# Patient Record
Sex: Male | Born: 2000 | Race: White | Hispanic: No | Marital: Single | Smoking: Former smoker
Health system: Southern US, Community
[De-identification: ages and names within clinical notes are randomized; demographics above are authoritative.]

## PROBLEM LIST (undated history)

## (undated) ENCOUNTER — Emergency Department (HOSPITAL_COMMUNITY): Disposition: A | Discharge status: Home / Self Care

## (undated) DIAGNOSIS — F419 Anxiety disorder, unspecified: Secondary | ICD-10-CM

## (undated) HISTORY — PX: MOUTH SURGERY: SHX715

---
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
## 2018-01-30 DIAGNOSIS — G8929 Other chronic pain: Secondary | ICD-10-CM | POA: Diagnosis not present

## 2018-01-30 DIAGNOSIS — Z87891 Personal history of nicotine dependence: Secondary | ICD-10-CM | POA: Diagnosis not present

## 2018-01-30 DIAGNOSIS — X509XXA Other and unspecified overexertion or strenuous movements or postures, initial encounter: Secondary | ICD-10-CM | POA: Diagnosis not present

## 2018-01-30 DIAGNOSIS — M25562 Pain in left knee: Secondary | ICD-10-CM | POA: Diagnosis not present

## 2019-05-27 ENCOUNTER — Other Ambulatory Visit: Payer: Self-pay

## 2019-05-27 ENCOUNTER — Ambulatory Visit: Payer: HRSA Program | Attending: Internal Medicine

## 2019-05-27 DIAGNOSIS — Z20828 Contact with and (suspected) exposure to other viral communicable diseases: Secondary | ICD-10-CM | POA: Diagnosis present

## 2019-05-27 DIAGNOSIS — Z20822 Contact with and (suspected) exposure to covid-19: Secondary | ICD-10-CM

## 2019-05-28 LAB — NOVEL CORONAVIRUS, NAA: SARS-CoV-2, NAA: NOT DETECTED

## 2019-06-17 ENCOUNTER — Emergency Department (HOSPITAL_COMMUNITY)
Admission: EM | Admit: 2019-06-17 | Discharge: 2019-06-17 | Disposition: A | Discharge status: Home / Self Care | Payer: No Typology Code available for payment source | Attending: Pediatric Emergency Medicine | Admitting: Pediatric Emergency Medicine

## 2019-06-17 ENCOUNTER — Emergency Department (HOSPITAL_COMMUNITY): Payer: No Typology Code available for payment source

## 2019-06-17 ENCOUNTER — Other Ambulatory Visit: Payer: Self-pay

## 2019-06-17 ENCOUNTER — Encounter (HOSPITAL_COMMUNITY): Payer: Self-pay | Admitting: Emergency Medicine

## 2019-06-17 DIAGNOSIS — F17210 Nicotine dependence, cigarettes, uncomplicated: Secondary | ICD-10-CM | POA: Insufficient documentation

## 2019-06-17 DIAGNOSIS — Y999 Unspecified external cause status: Secondary | ICD-10-CM | POA: Insufficient documentation

## 2019-06-17 DIAGNOSIS — S92355A Nondisplaced fracture of fifth metatarsal bone, left foot, initial encounter for closed fracture: Secondary | ICD-10-CM | POA: Insufficient documentation

## 2019-06-17 DIAGNOSIS — Z79899 Other long term (current) drug therapy: Secondary | ICD-10-CM | POA: Diagnosis not present

## 2019-06-17 DIAGNOSIS — Y939 Activity, unspecified: Secondary | ICD-10-CM | POA: Insufficient documentation

## 2019-06-17 DIAGNOSIS — Y929 Unspecified place or not applicable: Secondary | ICD-10-CM | POA: Insufficient documentation

## 2019-06-17 DIAGNOSIS — S99922A Unspecified injury of left foot, initial encounter: Secondary | ICD-10-CM | POA: Diagnosis present

## 2019-06-17 MED ORDER — IBUPROFEN 400 MG PO TABS
400.0000 mg | ORAL_TABLET | Freq: Once | ORAL | Status: AC
  Administered 2019-06-17: 09:00:00 400 mg via ORAL
  Filled 2019-06-17: qty 1

## 2019-06-17 NOTE — ED Notes (Signed)
patient awake alert,color pink,chest clear,good aeration,no retractions, 3plus pulses<2sec refill,patient with mother to drive home/in wr,discharged after avs reviewed

## 2019-06-17 NOTE — ED Notes (Addendum)
Please call Dan Maker Mcbride [phone]

## 2019-06-17 NOTE — ED Notes (Signed)
Patient awake alert, color pink,chest clear,good aeration,no retractions 3plus pulses<2sec refill,good pulses left foot after forklift ran over foot/lower leg, complains of pain from to to knee,10/10,provider at bedside

## 2019-06-17 NOTE — ED Notes (Signed)
Ortho at bedside.

## 2019-06-17 NOTE — ED Provider Notes (Signed)
[name] EMERGENCY DEPARTMENT Provider Note   CSN: [id number] Arrival date & time: 06/17/19  6213     History Chief Complaint  Patient presents with  . Foot Injury    Jose Ho is a 19 y.o. male.  HPI     19yo M with foot injury.  Run over by forklift prior to arrival.  Immediate pain to lower leg/foot.  Unable to bear weight.  No LOC.  No other injuries.  No other sick symptoms.  No previous injuries.  History reviewed. No pertinent past medical history.  There are no problems to display for this patient.   History reviewed. No pertinent surgical history.     No family history on file.  Social History   Tobacco Use  . Smoking status: Current Some Day Smoker  . Smokeless tobacco: Never Used  Substance Use Topics  . Alcohol use: No  . Drug use: No    Home Medications Prior to Admission medications   Medication Sig Start Date End Date Taking? Authorizing Provider  acetaminophen (TYLENOL) 325 MG tablet Take 650 mg by mouth every 6 (six) hours as needed.    [provider]  amoxicillin (AMOXIL) 500 MG capsule Take 1 capsule (500 mg total) by mouth 3 (three) times daily. 05/24/17   Lily Kocher, PA-C  HYDROcodone-acetaminophen (NORCO/VICODIN) 5-325 MG tablet Take 1 tablet by mouth every 4 (four) hours as needed. 05/24/17   Lily Kocher, PA-C  ibuprofen (ADVIL,MOTRIN) 600 MG tablet Take 1 tablet (600 mg total) by mouth 4 (four) times daily. 05/24/17   Lily Kocher, PA-C    Allergies    Patient has no known allergies.  Review of Systems   Review of Systems  Constitutional: Negative for chills and fever.  HENT: Negative for ear pain and sore throat.   Eyes: Negative for pain and visual disturbance.  Respiratory: Negative for cough and shortness of breath.   Cardiovascular: Negative for chest pain and palpitations.  Gastrointestinal: Negative for abdominal pain and vomiting.  Genitourinary: Negative for dysuria and  hematuria.  Musculoskeletal: Positive for arthralgias, gait problem, joint swelling and myalgias. Negative for back pain.  Skin: Negative for color change and rash.  Neurological: Negative for seizures and syncope.  All other systems reviewed and are negative.   Physical Exam Updated Vital Signs BP 123/72 (BP Location: Left Arm)   Pulse 81   Temp 98.4 F (36.9 C) (Oral)   Resp 19   SpO2 100%   Physical Exam Vitals and nursing note reviewed.  Constitutional:      Appearance: He is well-developed.  HENT:     Head: Normocephalic and atraumatic.     Nose: No congestion or rhinorrhea.  Eyes:     Extraocular Movements: Extraocular movements intact.     Conjunctiva/sclera: Conjunctivae normal.     Pupils: Pupils are equal, round, and reactive to light.  Cardiovascular:     Rate and Rhythm: Normal rate and regular rhythm.     Heart sounds: No murmur.  Pulmonary:     Effort: Pulmonary effort is normal. No respiratory distress.     Breath sounds: Normal breath sounds.  Abdominal:     Palpations: Abdomen is soft.     Tenderness: There is no abdominal tenderness.  Musculoskeletal:        General: Swelling and tenderness present. No deformity or signs of injury.     Cervical back: Neck supple.     Left lower leg: No edema.  Comments: Limitation to ROM at L ankle and foot, sensation intact, 2 second capillary refill, tender to palpation mid shin over fibula with pain with squeeze, over lateral maleolus and lateral foot  Skin:    General: Skin is warm and dry.     Capillary Refill: Capillary refill takes less than 2 seconds.  Neurological:     General: No focal deficit present.     Mental Status: He is alert and oriented to person, place, and time.     Cranial Nerves: No cranial nerve deficit.     Sensory: No sensory deficit.     Motor: Weakness present.     Coordination: Coordination normal.     Gait: Gait abnormal.     Deep Tendon Reflexes: Reflexes normal.     ED  Results / Procedures / Treatments   Labs (all labs ordered are listed, but only abnormal results are displayed) Labs Reviewed - No data to display  EKG None  Radiology DG Tibia/Fibula Left  Result Date: 06/17/2019 CLINICAL DATA:  Left foot injury at work, pain, initial encounter. EXAM: LEFT TIBIA AND FIBULA - 2 VIEW COMPARISON:  None. FINDINGS: No acute osseous or joint abnormality. IMPRESSION: No acute osseous or joint abnormality. Electronically Signed   By: Leanna Battles M.D.   On: 06/17/2019 08:50   DG Foot Complete Left  Result Date: 06/17/2019 CLINICAL DATA:  Posttraumatic left foot pain EXAM: LEFT FOOT - COMPLETE 3+ VIEW COMPARISON:  None. FINDINGS: Avulsion fracture at the base of the fifth metatarsal, nondisplaced. There is overlying soft tissue swelling. IMPRESSION: Nondisplaced fifth metatarsal base fracture. Electronically Signed   By: Marnee Spring M.D.   On: 06/17/2019 08:17    Procedures Procedures (including critical care time)  Medications Ordered in ED Medications  ibuprofen (ADVIL) tablet 400 mg (400 mg Oral Given 06/17/19 0845)    ED Course  I have reviewed the triage vital signs and the nursing notes.  Pertinent labs & imaging results that were available during my care of the patient were reviewed by me and considered in my medical decision making (see chart for details).    MDM Rules/Calculators/A&P                       Pt is a with out pertinent PMHX who presents w/ L leg injury.   Hemodynamically appropriate and stable on room air with normal saturations.  Lungs clear to auscultation bilaterally good air exchange.  Normal cardiac exam.  Benign abdomen.  No hip pain no knee pain bilaterally.  L tib/fib, lateral foot tender to palpation  Patient with swelling but has no obvious deformity on exam. Patient neurovascularly intact - good pulses, full movement - slightly decreased only 2/2 pain. Imaging obtained and resulted above.  Doubt nerve or vascular  injury at this time.  No other injuries appreciated on exam.  Base of 5th metatarsal fracture.  Nondisplaced on my interpretation.  Radiology read as above.  I personally reviewed and agree.  Pain control with Motrin here.  Patient placed in CAM walker and provided crutches instruction for non weight bearing with plan for close orthopedics follow-up.  D/C home in stable condition. Follow-up with orthopedics.   Final Clinical Impression(s) / ED Diagnoses Final diagnoses:  Closed nondisplaced fracture of fifth metatarsal bone of left foot, initial encounter    Rx / DC Orders ED Discharge Orders    None       Charlett Nose, MD 06/17/19 [phone]

## 2019-06-17 NOTE — Progress Notes (Signed)
Orthopedic Tech Progress Note Patient Details:  Jose Ho 04-Mar-2001 [id number]  Ortho Devices Type of Ortho Device: Crutches, CAM walker Ortho Device/Splint Location: LLE Ortho Device/Splint Interventions: Application, Ordered   Post Interventions Patient Tolerated: Ambulated well, Well Instructions Provided: Poper ambulation with device, Care of device, Adjustment of device   Donald Pore 06/17/2019, 9:38 AM

## 2019-06-17 NOTE — ED Notes (Signed)
Patient awake alert,returned from xray.tolerated po med, refuses ice,awaiting results

## 2019-06-17 NOTE — ED Triage Notes (Signed)
Pt arrives via [employer] from work with c/o a forklift running over his L foot. PMS intact, swelling and bruising present to left foot. No back or neck pain, no LOC, VSS: 129/90, HR 82, RR 16, O298% on ra, 97temp.

## 2019-06-17 NOTE — ED Notes (Signed)
Patient transported to X-ray via stretcher with tech. 

## 2020-03-04 ENCOUNTER — Other Ambulatory Visit: Payer: Self-pay

## 2020-03-04 ENCOUNTER — Encounter (HOSPITAL_COMMUNITY): Payer: Self-pay

## 2020-03-04 ENCOUNTER — Emergency Department (HOSPITAL_COMMUNITY)
Admission: EM | Admit: 2020-03-04 | Discharge: 2020-03-05 | Disposition: A | Discharge status: Home / Self Care | Payer: Self-pay | Attending: Emergency Medicine | Admitting: Emergency Medicine

## 2020-03-04 DIAGNOSIS — Z8616 Personal history of COVID-19: Secondary | ICD-10-CM | POA: Insufficient documentation

## 2020-03-04 DIAGNOSIS — R42 Dizziness and giddiness: Secondary | ICD-10-CM | POA: Insufficient documentation

## 2020-03-04 DIAGNOSIS — Z5321 Procedure and treatment not carried out due to patient leaving prior to being seen by health care provider: Secondary | ICD-10-CM | POA: Insufficient documentation

## 2020-03-04 LAB — CBG MONITORING, ED: Glucose-Capillary: 154 mg/dL — ABNORMAL HIGH (ref 70–99)

## 2020-03-04 NOTE — ED Triage Notes (Signed)
Pt reports being positive for covid, this is his 10th day, pt reports he has had intermittent numbness to both wrists, pt reports dizziness and jitters, does "not know if this is a panic attack" pt reports a lot of stress recently.

## 2020-04-12 IMAGING — CR DG FOOT COMPLETE 3+V*L*
3 series · 3 of 3 positions shown · non-contrast
Comparison: None.

CLINICAL DATA: Posttraumatic left foot pain

EXAM:
LEFT FOOT - COMPLETE 3+ VIEW

[foot ap]
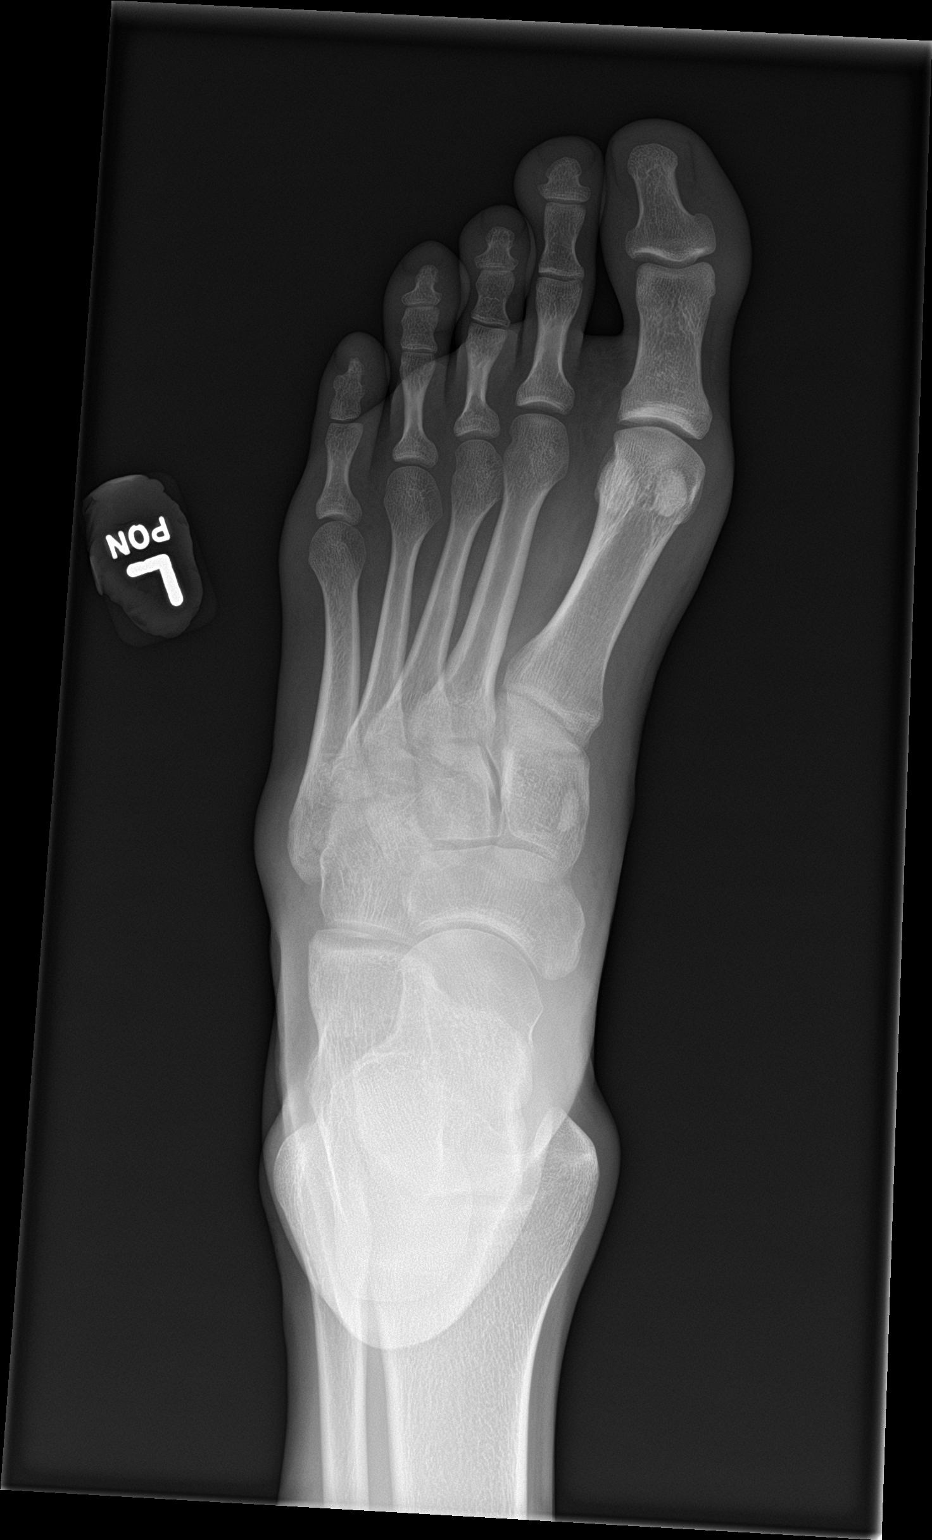

[foot obl]
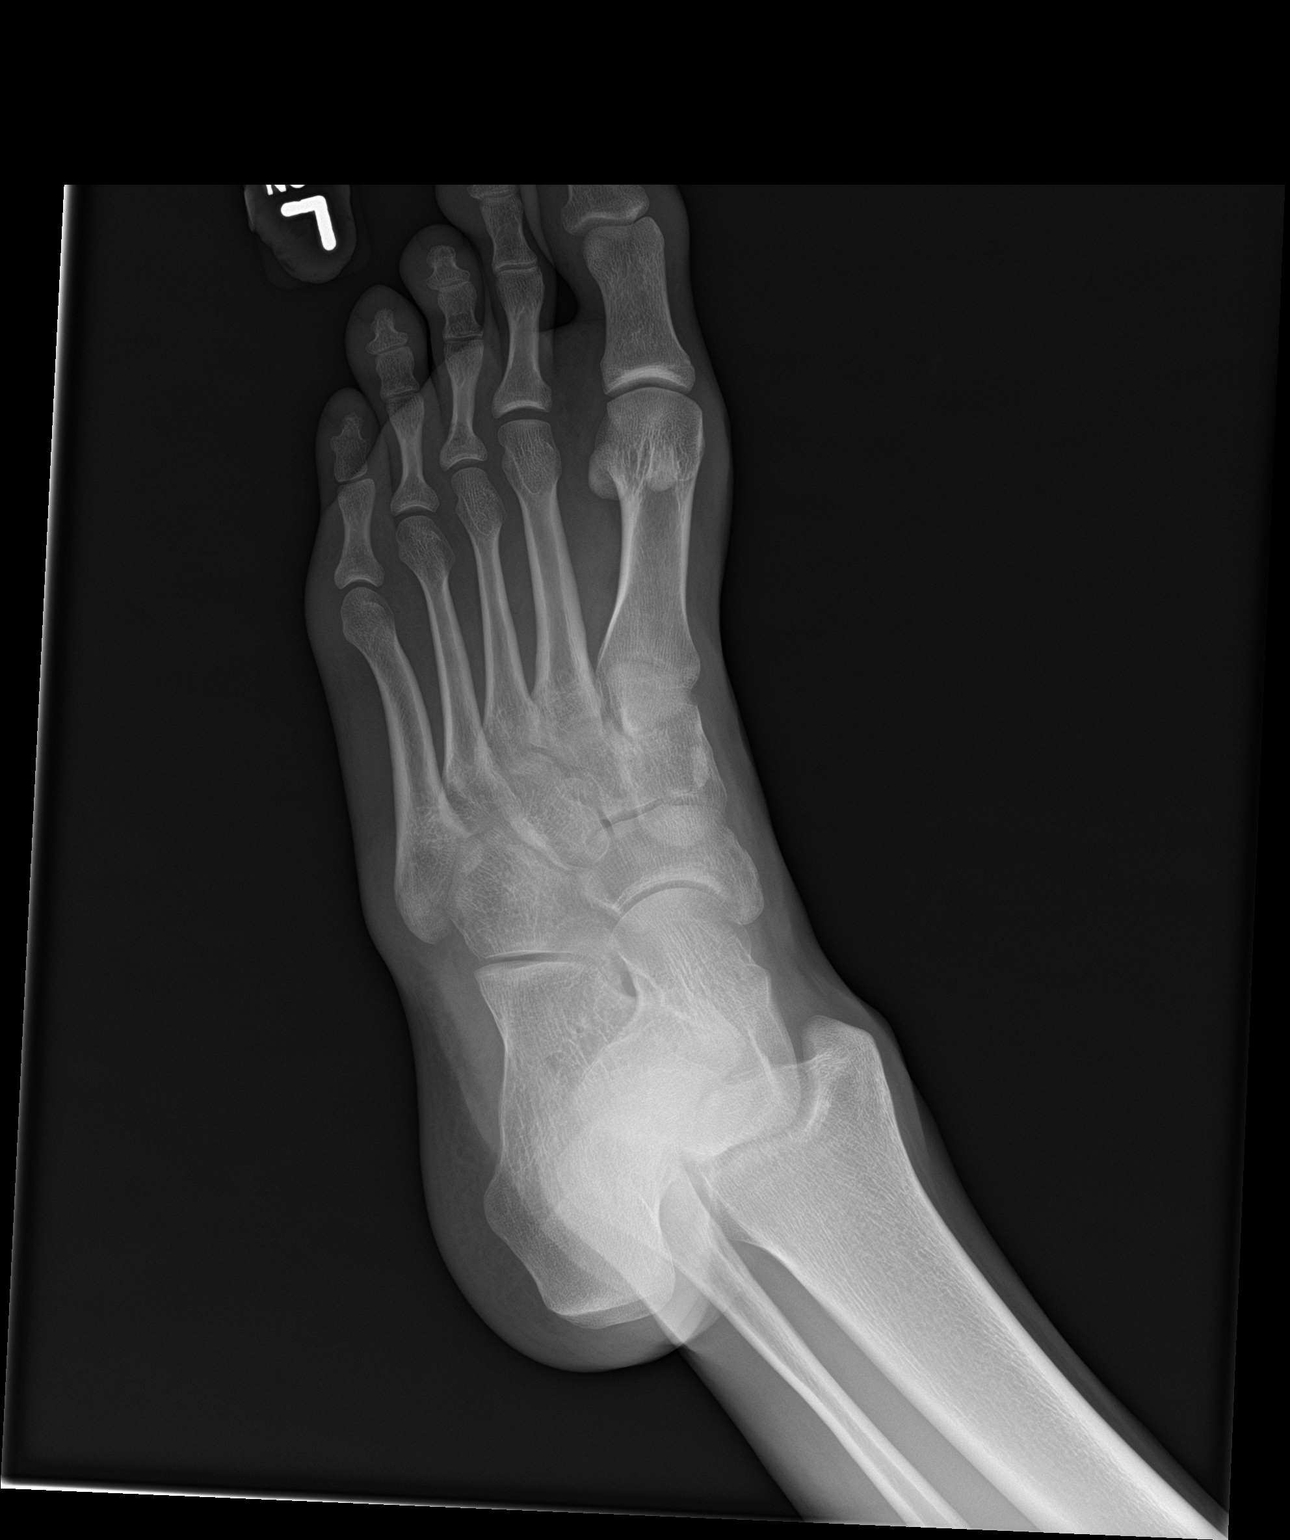

[foot lat]
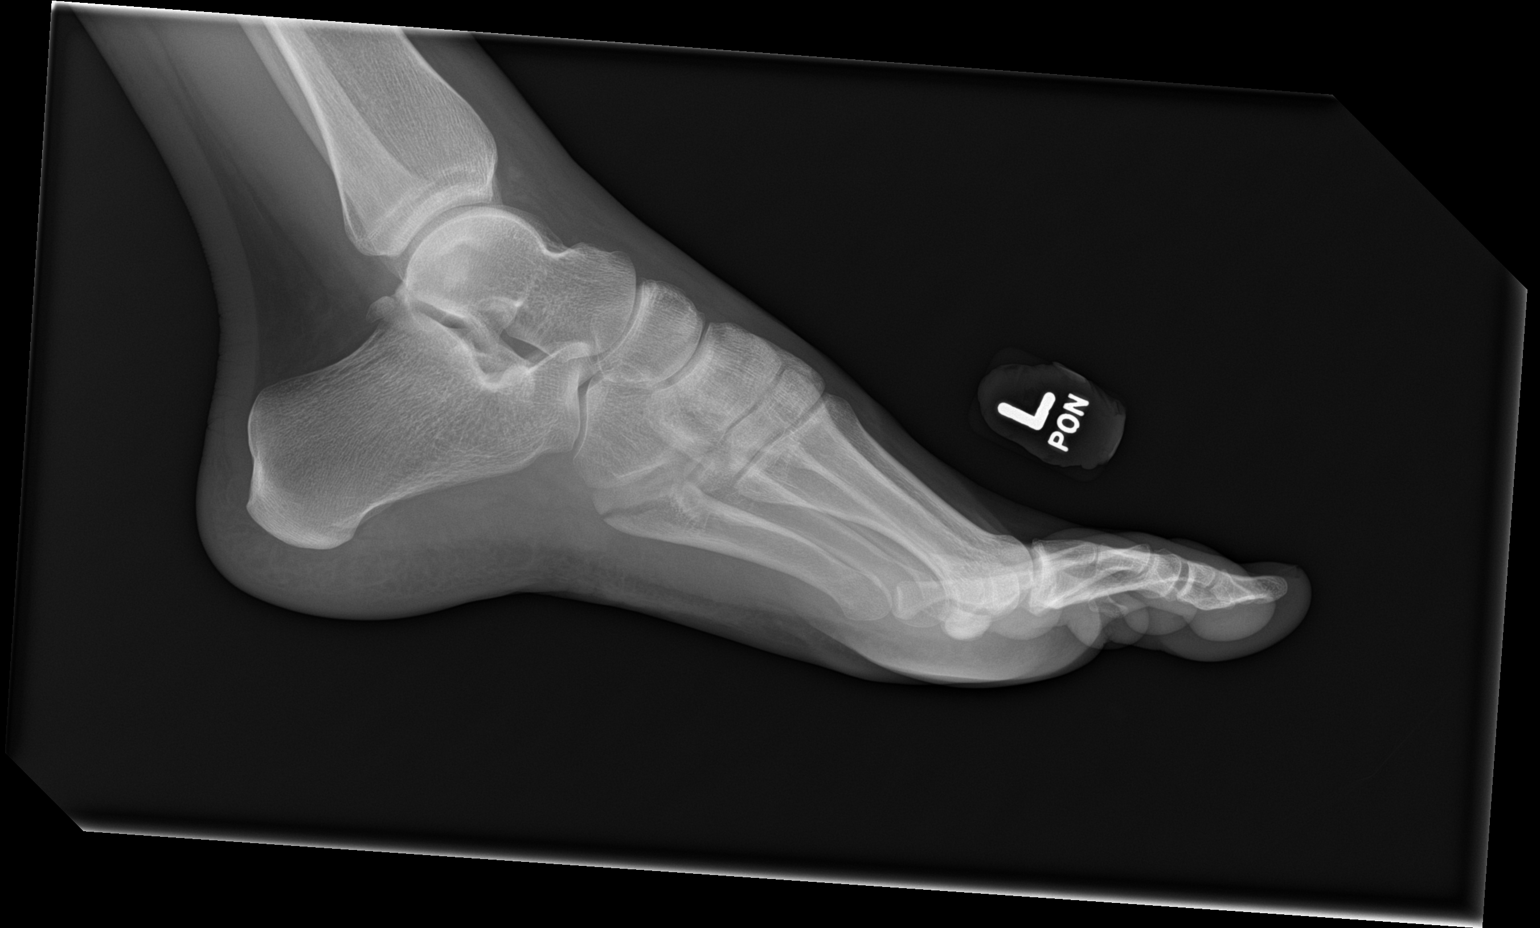

[3 of 3 positions shown; findings below may reference images not displayed]

FINDINGS: Avulsion fracture at the base of the fifth metatarsal, nondisplaced.
There is overlying soft tissue swelling.
IMPRESSION: Nondisplaced fifth metatarsal base fracture.

## 2022-11-29 ENCOUNTER — Other Ambulatory Visit: Payer: Self-pay

## 2022-11-29 ENCOUNTER — Ambulatory Visit (HOSPITAL_COMMUNITY)
Admission: RE | Admit: 2022-11-29 | Discharge: 2022-11-29 | Disposition: A | Discharge status: Home / Self Care | Payer: 59 | Source: Ambulatory Visit | Attending: Family Medicine | Admitting: Family Medicine

## 2022-11-29 DIAGNOSIS — J069 Acute upper respiratory infection, unspecified: Secondary | ICD-10-CM | POA: Diagnosis present

## 2024-02-08 ENCOUNTER — Other Ambulatory Visit: Payer: Self-pay

## 2024-02-08 ENCOUNTER — Emergency Department (HOSPITAL_COMMUNITY)
Admission: EM | Admit: 2024-02-08 | Discharge: 2024-02-08 | Disposition: A | Discharge status: Home / Self Care | Attending: Emergency Medicine | Admitting: Emergency Medicine

## 2024-02-08 ENCOUNTER — Encounter (HOSPITAL_COMMUNITY): Payer: Self-pay

## 2024-02-08 DIAGNOSIS — S61511A Laceration without foreign body of right wrist, initial encounter: Secondary | ICD-10-CM | POA: Insufficient documentation

## 2024-02-08 DIAGNOSIS — W268XXA Contact with other sharp object(s), not elsewhere classified, initial encounter: Secondary | ICD-10-CM | POA: Diagnosis not present

## 2024-02-08 MED ORDER — CEPHALEXIN 500 MG PO CAPS: 500.0000 mg | ORAL_CAPSULE | Freq: Three times a day (TID) | ORAL | 0 refills | Status: AC

## 2024-02-08 MED ORDER — POVIDONE-IODINE 10 % EX SOLN
CUTANEOUS | Status: AC
  Filled 2024-02-08: qty 14.8

## 2024-02-08 MED ORDER — TRIPLE ANTIBIOTIC 3.5-400-5000 EX OINT
TOPICAL_OINTMENT | Freq: Once | CUTANEOUS | Status: AC
  Administered 2024-02-08: 1 via CUTANEOUS
  Filled 2024-02-08: qty 1

## 2024-02-08 MED ORDER — LIDOCAINE-EPINEPHRINE (PF) 2 %-1:200000 IJ SOLN
20.0000 mL | Freq: Once | INTRAMUSCULAR | Status: AC
  Administered 2024-02-08: 20 mL
  Filled 2024-02-08: qty 20

## 2024-02-08 NOTE — Discharge Instructions (Signed)
 Please continue good wound care as discussed at the bedside.  Take all antibiotics as directed.  Follow-up with your primary care doctor or return to emergency department in 7 days to have suture removed.  Return to emergency department immediately for any new or worsening symptoms or if any signs of infection are noted.

## 2024-02-08 NOTE — ED Provider Notes (Signed)
 [place] EMERGENCY DEPARTMENT AT [facility] Provider Note   CSN: [id number] Arrival date & time: 02/08/24  1103     Patient presents with: Extremity Laceration   Jose Ho is a 23 y.o. male.   Patient is a 23 year old male who presents emergency department the chief complaint of a laceration to the volar aspect of his right wrist.  Patient notes that he was working on a vehicle when he accidentally cut it.  Patient notes that his tetanus shot is up-to-date.  He has no known history of bleeding disorders or current anticoagulation use.  He denies any numbness or paresthesias distally.  He notes he is test full range of motion of the distal digits.        Prior to Admission medications   Medication Sig Start Date End Date Taking? Authorizing Provider  cephALEXin  (KEFLEX ) 500 MG capsule Take 1 capsule (500 mg total) by mouth 3 (three) times daily for 7 days. 02/08/24 02/15/24 Yes Iyannah Blake, Lonni D, PA-C  acetaminophen  (TYLENOL ) 325 MG tablet Take 650 mg by mouth every 6 (six) hours as needed.    [provider]  amoxicillin  (AMOXIL ) 500 MG capsule Take 1 capsule (500 mg total) by mouth 3 (three) times daily. 05/24/17   Armida Culver, PA-C  HYDROcodone -acetaminophen  (NORCO/VICODIN) 5-325 MG tablet Take 1 tablet by mouth every 4 (four) hours as needed. 05/24/17   Armida Culver, PA-C  ibuprofen  (ADVIL ,MOTRIN ) 600 MG tablet Take 1 tablet (600 mg total) by mouth 4 (four) times daily. 05/24/17   Armida Culver, PA-C    Allergies: Patient has no known allergies.    Review of Systems  Skin:        Laceration to the right wrist  All other systems reviewed and are negative.   Updated Vital Signs BP (!) 93/53   Pulse 77   Temp 98.3 F (36.8 C) (Oral)   Resp 18   Ht 6' 2 (1.88 m)   Wt 61.2 kg   SpO2 97%   BMI 17.33 kg/m   Physical Exam Vitals and nursing note reviewed.  Constitutional:      Appearance: Normal appearance.  HENT:     Head:  Normocephalic and atraumatic.  Cardiovascular:     Rate and Rhythm: Normal rate and regular rhythm.     Pulses: Normal pulses.     Heart sounds: Normal heart sounds.  Pulmonary:     Effort: Pulmonary effort is normal.  Musculoskeletal:        General: Normal range of motion.     Comments: No bony tenderness noted throughout, full range of motion noted to distal digits with each joint isolated of the right upper extremity, cap refill less than 2 seconds distally, radial pulse 2+ in right upper extremity, sensation intact distally  Skin:    General: Skin is warm and dry.     Comments: 2 cm laceration noted to the volar aspect of the right wrist, wound evaluated to depth with adequate lighting with no indication for foreign body within the wound, no apparent involvement of the underlying musculature or tendons  Neurological:     General: No focal deficit present.     Mental Status: He is alert and oriented to person, place, and time. Mental status is at baseline.  Psychiatric:        Mood and Affect: Mood normal.        Behavior: Behavior normal.        Thought Content: Thought content normal.  Judgment: Judgment normal.     (all labs ordered are listed, but only abnormal results are displayed) Labs Reviewed - No data to display  EKG: None  Radiology: No results found.   .Laceration Repair  Date/Time: 02/08/2024 12:21 PM  Performed by: Daralene Lonni BIRCH, PA-C Authorized by: Daralene Lonni BIRCH, PA-C   Consent:    Consent obtained:  Verbal   Consent given by:  Patient   Risks, benefits, and alternatives were discussed: yes     Risks discussed:  Infection, pain and retained foreign body   Alternatives discussed:  No treatment Universal protocol:    Procedure explained and questions answered to patient or proxy's satisfaction: yes     Immediately prior to procedure, a time out was called: yes     Patient identity confirmed:  Verbally with patient, arm band,  provided demographic data and hospital-assigned identification number Anesthesia:    Anesthesia method:  Local infiltration   Local anesthetic:  Lidocaine  2% WITH epi Laceration details:    Location:  Shoulder/arm   Shoulder/arm location:  R lower arm   Length (cm):  2 Pre-procedure details:    Preparation:  Patient was prepped and draped in usual sterile fashion and imaging obtained to evaluate for foreign bodies Exploration:    Limited defect created (wound extended): no     Hemostasis achieved with:  Direct pressure and epinephrine    Wound exploration: wound explored through full range of motion and entire depth of wound visualized     Wound extent: fascia not violated, no foreign body, no signs of injury, no nerve damage, no tendon damage, no underlying fracture and no vascular damage     Contaminated: no   Treatment:    Area cleansed with:  Povidone-iodine  and saline   Amount of cleaning:  Extensive   Irrigation solution:  Sterile saline   Irrigation volume:  Copious   Irrigation method:  Pressure wash and syringe Skin repair:    Repair method:  Sutures   Suture size:  4-0   Suture material:  Prolene   Suture technique:  Simple interrupted   Number of sutures:  5 Approximation:    Approximation:  Close Repair type:    Repair type:  Simple Post-procedure details:    Dressing:  Antibiotic ointment and non-adherent dressing   Procedure completion:  Tolerated well, no immediate complications    Medications Ordered in the ED  lidocaine -EPINEPHrine  (XYLOCAINE  W/EPI) 2 %-1:200000 (PF) injection 20 mL (has no administration in time range)  povidone-iodine  (BETADINE ) 10 % external solution (has no administration in time range)  neomycin -bacitracin -polymyxin [phone] OINT (has no administration in time range)                                    Medical Decision Making Patient is doing well at this time and is stable for discharge home.  Laceration to the right wrist was  repaired at the bedside.  There was no foreign bodies noted within the wound and wound was evaluated to depth with adequate lighting.  There was no apparent involvement of underlying musculature or tendons.  Patient was neurovascularly intact distally.  He had intact range of motion to distal digits with each joint isolated.  Discussed the need for continued good wound care on an outpatient basis.  He is already up-to-date on his tetanus shot.  Strict turn precautions were provided for any new or worsening symptoms.  He was directed to return in 7 days to have sutures removed.  Patient voiced understand to the plan and had no additional questions.  Risk OTC drugs. Prescription drug management.        Final diagnoses:  Laceration of right wrist, initial encounter    ED Discharge Orders          Ordered    cephALEXin  (KEFLEX ) 500 MG capsule  3 times daily        02/08/24 1209               Daralene Lonni JONETTA DEVONNA 02/08/24 1223    Suzette Pac, MD 02/08/24 1649

## 2024-02-08 NOTE — ED Triage Notes (Signed)
 Pt was working on a car and cut his wrist on a piece of metal. Deep laceration to right wrist

## 2024-02-09 NOTE — ED Notes (Signed)
 Pt requested Dr excuse for work . EDP aware and agreed note. Pt aware to pick up note at registration. 02/09/24 @0815 

## 2024-02-16 ENCOUNTER — Emergency Department (HOSPITAL_COMMUNITY)
Admission: EM | Admit: 2024-02-16 | Discharge: 2024-02-16 | Disposition: A | Discharge status: Home / Self Care | Attending: Emergency Medicine | Admitting: Emergency Medicine

## 2024-02-16 ENCOUNTER — Encounter (HOSPITAL_COMMUNITY): Payer: Self-pay | Admitting: Emergency Medicine

## 2024-02-16 ENCOUNTER — Other Ambulatory Visit: Payer: Self-pay

## 2024-02-16 DIAGNOSIS — Z4802 Encounter for removal of sutures: Secondary | ICD-10-CM | POA: Insufficient documentation

## 2024-02-16 DIAGNOSIS — R200 Anesthesia of skin: Secondary | ICD-10-CM | POA: Diagnosis present

## 2024-02-16 NOTE — ED Triage Notes (Signed)
 Pt needs a wound check and stitch removal to the right wrist.

## 2024-02-16 NOTE — Discharge Instructions (Signed)
 Continue to keep your thumb clean and dry, allow the Steri-Strips to fall away naturally, they should stay in place for 5 to 7 days.  Given your persistent numbness in your thumb you do need to see a hand specialist as discussed, call Dr. Onesimo office tomorrow morning to arrange an office visit this week.

## 2024-02-16 NOTE — ED Provider Notes (Signed)
 [place] EMERGENCY DEPARTMENT AT [facility] Provider Note   CSN: [id number] Arrival date & time: 02/16/24  1952     Patient presents with: Suture / Staple Removal   Jose Ho is a 23 y.o. male here for suture removal from laceration sustained to his right volar wrist/thenar eminence 8 days ago.  Had no problems with the wound other than pruritus and has kept it clean using hydrogen peroxide.  Jose Ho does note however persistent numbness in the thumb since the day of the injury.  Jose Ho denies weakness in the thumb but states Jose Ho cannot always tell when Jose Ho is gripping an object firmly enough due to the loss of sensation.  There has been no drainage from the wound site, has no other complaints of pain.  Patient works as a Games developer.   The history is provided by the patient.       Prior to Admission medications   Medication Sig Start Date End Date Taking? Authorizing Provider  acetaminophen  (TYLENOL ) 325 MG tablet Take 650 mg by mouth every 6 (six) hours as needed.    [provider]  amoxicillin  (AMOXIL ) 500 MG capsule Take 1 capsule (500 mg total) by mouth 3 (three) times daily. 05/24/17   Armida Culver, PA-C  HYDROcodone -acetaminophen  (NORCO/VICODIN) 5-325 MG tablet Take 1 tablet by mouth every 4 (four) hours as needed. 05/24/17   Armida Culver, PA-C  ibuprofen  (ADVIL ,MOTRIN ) 600 MG tablet Take 1 tablet (600 mg total) by mouth 4 (four) times daily. 05/24/17   Armida Culver, PA-C    Allergies: Patient has no known allergies.    Review of Systems  Constitutional:  Negative for chills and fever.  Skin:  Positive for wound.  Neurological:  Positive for numbness.    Updated Vital Signs BP (!) 133/91 (BP Location: Right Arm)   Pulse 85   Temp 98.5 F (36.9 C) (Oral)   Resp 15   Ht 6' 2 (1.88 m)   Wt 61.2 kg   SpO2 100%   BMI 17.33 kg/m   Physical Exam Constitutional:      Appearance: Jose Ho is well-developed.  HENT:     Head: Normocephalic.   Cardiovascular:     Rate and Rhythm: Normal rate.  Pulmonary:     Effort: Pulmonary effort is normal.  Skin:    Findings: Laceration present.     Comments: Well-healing and well-approximated laceration right volar thenar eminence/wrist area.  #5 sutures in place.  Neurological:     Mental Status: Jose Ho is alert and oriented to person, place, and time.     Sensory: No sensory deficit.     Comments: Patient has full range of motion of the right thumb and full strength with extension and flexion.  Jose Ho has no sensation to fine touch to the entirety of the thumb extending onto the thenar eminence.  Jose Ho does have sensation to deep pressure and sharp sensation.  Less than 2-second cap refill.     (all labs ordered are listed, but only abnormal results are displayed) Labs Reviewed - No data to display  EKG: None  Radiology: No results found.   Procedures    SUTURE REMOVAL Performed by: Mliss Narrow  Consent: Verbal consent obtained. Patient identity confirmed: provided demographic data Time out: Immediately prior to procedure a time out was called to verify the correct patient, procedure, equipment, support staff and site/side marked as required.  Location details: right wrist  Wound Appearance: clean  Sutures/Staples Removed: 5  Facility:  sutures placed in this facility Patient tolerance: Patient tolerated the procedure well with no immediate complications.  Steri strips applied to provide extra support x 4-5 days.    Medications Ordered in the ED - No data to display                                  Medical Decision Making Patient presenting with complaint of numbness of his right thumb status post laceration repair occurring 8 days ago.  The wound appears very well healed without infection.  Sutures were removed without complication.  Steri-Strips were applied for little extra support while wound edges continue to heal.  Given decreased sensation Jose Ho has been referred to  Dr. Onesimo for further evaluation.        Final diagnoses:  Visit for suture removal  Numbness of left thumb    ED Discharge Orders     None          Birdena Mliss RIGGERS 02/16/24 2247    Suzette Pac, MD 02/17/24 1146

## 2024-02-21 ENCOUNTER — Ambulatory Visit (INDEPENDENT_AMBULATORY_CARE_PROVIDER_SITE_OTHER): Admitting: Orthopedic Surgery

## 2024-02-21 ENCOUNTER — Encounter: Payer: Self-pay | Admitting: Orthopedic Surgery

## 2024-02-21 VITALS — BP 136/84 | Ht 74.0 in | Wt 135.0 lb

## 2024-02-21 DIAGNOSIS — S6431XA Injury of digital nerve of right thumb, initial encounter: Secondary | ICD-10-CM | POA: Diagnosis not present

## 2024-02-21 NOTE — Progress Notes (Signed)
  Intake history:  Chief Complaint  Patient presents with   Laceration    Right base of thumb      BP 136/84 Comment: 02/16/24  Ht 6' 2 (1.88 m)   Wt 135 lb (61.2 kg)   BMI 17.33 kg/m  Body mass index is 17.33 kg/m.  Pharmacy? ________________[facility] ______________________  WHAT ARE WE SEEING YOU FOR TODAY?   Base of right thumb laceration 02/08/24  How long has this bothered you? (DOI?DOS?WS?)  on 02/08/24  Was there an injury? Yes  Anticoag.  No  Diabetes No  Heart disease No  Hypertension No  SMOKING HX Yes  Kidney disease No  Any ALLERGIES _______________No Known Allergies _______________________________   Treatment:  Have you taken:  Tylenol  Yes  Advil  Yes  Had PT No  Had injection No  Other  _______________ER __________

## 2024-02-21 NOTE — Patient Instructions (Addendum)
 We are referring you to [facility] from [facility] address is [address] [place] The phone number is [phone]  The office will call you with an appointment Dr. Erwin  OOW 9/21 to OCT 5

## 2024-02-21 NOTE — Progress Notes (Signed)
 Office Visit Note   Patient: Jose Ho           Date of Birth: 2001/03/25           MRN: [id number] Visit Date: 02/21/2024 Requested by: Marvine Rush, MD [address] [place],  [place] [address] PCP: Marvine Rush, MD   Assessment & Plan:   Encounter Diagnosis  Name Primary?   Laceration of digital nerve of right thumb, initial encounter Yes    No orders of the defined types were placed in this encounter.   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  He is not happy with the loss of sensation so I am referring him to a hand specialist for possible digital nerve repair   Subjective: Chief Complaint  Patient presents with   Laceration    Right base of thumb     HPI: 23 year old male laceration right thumb on 6 September.  Treated in the ER with irrigation and wound closure.  The patient went back to the ER for suture removal and alerted the physician that his thumb was numb on the radial side  They referred him to us  for management              ROS: Normal thumb extension at the IP joint normal flexion.   Images personally read and my interpretation : No imaging  Visit Diagnoses:  1. Laceration of digital nerve of right thumb, initial encounter      Follow-Up Instructions: Return for REFERRAL.    Objective: Vital Signs: BP 136/84 Comment: 02/16/24  Ht 6' 2 (1.88 m)   Wt 135 lb (61.2 kg)   BMI 17.33 kg/m   Physical Exam Vitals and nursing note reviewed. Exam conducted with a chaperone present.  Constitutional:      General: He is not in acute distress.    Appearance: Normal appearance. He is normal weight. He is not ill-appearing, toxic-appearing or diaphoretic.  HENT:     Head: Normocephalic and atraumatic.  Eyes:     General: No scleral icterus.       Right eye: No discharge.        Left eye: No discharge.     Extraocular Movements: Extraocular movements intact.     Pupils: Pupils  are equal, round, and reactive to light.  Cardiovascular:     Rate and Rhythm: Normal rate.     Pulses: Normal pulses.  Pulmonary:     Effort: Pulmonary effort is normal.  Skin:    General: Skin is warm and dry.     Capillary Refill: Capillary refill takes less than 2 seconds.     Comments: Laceration on the radial volar aspect of the wrist near the base of the first metacarpal  Negative Tinel's No sensation on the radial border of the thumb or tip of the thumb and even has decreased sensation on the ulnar side of the thumb   Neurological:     Mental Status: He is alert and oriented to person, place, and time.     Motor: No weakness.     Gait: Gait normal.  Psychiatric:        Mood and Affect: Mood normal.     Specialty Comments:  No specialty comments available.  Imaging: No results found.   PMFS History: There are no active problems to display for this patient.  History reviewed. No pertinent past medical history.  History reviewed. No pertinent family history.  History reviewed. No pertinent surgical  history. Social History   Occupational History   Not on file  Tobacco Use   Smoking status: Some Days    Current packs/day: 0.50    Types: E-cigarettes, Cigarettes   Smokeless tobacco: Never  Substance and Sexual Activity   Alcohol use: No   Drug use: No   Sexual activity: Not on file

## 2024-02-26 ENCOUNTER — Ambulatory Visit (INDEPENDENT_AMBULATORY_CARE_PROVIDER_SITE_OTHER): Admitting: Orthopedic Surgery

## 2024-02-26 ENCOUNTER — Encounter (HOSPITAL_BASED_OUTPATIENT_CLINIC_OR_DEPARTMENT_OTHER): Payer: Self-pay | Admitting: Orthopedic Surgery

## 2024-02-26 DIAGNOSIS — S648X1A Injury of other nerves at wrist and hand level of right arm, initial encounter: Secondary | ICD-10-CM | POA: Diagnosis not present

## 2024-02-26 DIAGNOSIS — S6431XA Injury of digital nerve of right thumb, initial encounter: Secondary | ICD-10-CM | POA: Diagnosis not present

## 2024-02-26 NOTE — Addendum Note (Signed)
 Addended by: Ember Henrikson G on: 02/26/2024 03:02 PM   Modules accepted: Orders

## 2024-02-26 NOTE — H&P (View-Only) (Signed)
 Jose Ho - 23 y.o. male MRN [id number]  Date of birth: June 09, 2000  Office Visit Note: Visit Date: 02/26/2024 PCP: Marvine Rush, MD Referred by: Marvine Rush, MD  Subjective: No chief complaint on file.  HPI: Jose Ho is a pleasant 23 y.o. male who presents today for evaluation of a right wrist laceration sustained approximately 3 years prior, with associated numbness persistent in the right thumb after the incident.  It was a deep laceration that was sustained, he is seen in the emergency department setting, underwent bedside irrigation and closure.  Hand remains warm well-perfused, his numbness is persistent throughout the left thumb with positive sensitivity along this region, particularly when he pushes on the laceration site.  He is right-hand dominant, works as a Games developer.  Pertinent ROS were reviewed with the patient and found to be negative unless otherwise specified above in HPI.   Visit Reason: right wrist laceration, numbness in right thumb  Duration of symptoms: since 02/08/2024 Hand dominance: right Occupation: Games developer Diabetic: No Smoking: former, uses [facility] History: none Blood Thinners: none  Prior Testing/EMG: x-rays Injections (Date): none Treatments: sutures Prior Surgery: none  Assessment & Plan: Visit Diagnoses:  1. 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 Laceration of digital nerve of right thumb, initial encounter     Plan: Extensive discussion was had with the patient today regarding his right wrist laceration injury.  We discussed the underlying anatomy in this region that may have been disrupted, including the dorsal radial sensory nerve in particular.  He does have some ongoing numbness throughout the distal aspect of the thumb, positive Tinel's over the [facility] region with radiating pain up the thumb as well.  From from a conservative standpoint, we  discussed ongoing observation to allow for nerve regeneration and healing.  We discussed the possibility of neuroma formation should the nerve have been traumatized, which it does appear it has been based on his clinical examination.  From a surgical standpoint, we discussed wound exploration, debridement of any devitalized tissue and exploration of the dorsal radial sensory nerve in particular to look for any significant injury that requires repair or neurolysis.  Risks and benefits of the procedure were discussed, risks including but not limited to infection, bleeding, scarring, stiffness, nerve injury, tendon injury, vascular injury, persistent numbness or dysfunction, recurrence of symptoms and need for subsequent operation.  We also discussed the appropriate postoperative protocol and timeframe for return to activities and function.  Patient expressed understanding.  He would like to proceed with surgical intervention as discussed at the next available date.  Follow-up: No follow-ups on file.   Meds & Orders: No orders of the defined types were placed in this encounter.  No orders of the defined types were placed in this encounter.    Procedures: No procedures performed      Clinical History: No specialty comments available.  He reports that he has been smoking e-cigarettes and cigarettes. He has never used smokeless tobacco. No results for input(s): HGBA1C, LABURIC in the last 8760 hours.  Objective:   Vital Signs: There were no vitals taken for this visit.  Physical Exam  Gen: Well-appearing, in no acute distress; non-toxic CV: Regular Rate. Well-perfused. Warm.  Resp: Breathing unlabored on room air; no wheezing. Psych: Fluid speech in conversation; appropriate affect; normal thought process  Ortho Exam Left wrist: - 2 cm laceration over the dorsal radial aspect of  the wrist, glabrous/nonglabrous border near the level of the [facility] region - Positive Tinel's over the laceration  site, radiating pain along the dorsal radial sensory nerve distribution volarly - Range of motion of the thumb is preserved, abduction is preserved, IP flexion extension fully preserved - Thumb remains warm well-perfused, normal color and capillary refill  Imaging: No results found.  Past Medical/Family/Surgical/Social History: Medications & Allergies reviewed per EMR, new medications updated. There are no active problems to display for this patient.  No past medical history on file. No family history on file. No past surgical history on file. Social History   Occupational History   Not on file  Tobacco Use   Smoking status: Some Days    Current packs/day: 0.50    Types: E-cigarettes, Cigarettes   Smokeless tobacco: Never  Substance and Sexual Activity   Alcohol use: No   Drug use: No   Sexual activity: Not on file    Purnell Daigle Estela) Arlinda, M.D. [facility], Hand Surgery

## 2024-02-26 NOTE — Progress Notes (Signed)
 Jose Ho - 23 y.o. male MRN [id number]  Date of birth: June 09, 2000  Office Visit Note: Visit Date: 02/26/2024 PCP: Marvine Rush, MD Referred by: Marvine Rush, MD  Subjective: No chief complaint on file.  HPI: Jose Ho is a pleasant 23 y.o. male who presents today for evaluation of a right wrist laceration sustained approximately 3 years prior, with associated numbness persistent in the right thumb after the incident.  It was a deep laceration that was sustained, he is seen in the emergency department setting, underwent bedside irrigation and closure.  Hand remains warm well-perfused, his numbness is persistent throughout the left thumb with positive sensitivity along this region, particularly when he pushes on the laceration site.  He is right-hand dominant, works as a Games developer.  Pertinent ROS were reviewed with the patient and found to be negative unless otherwise specified above in HPI.   Visit Reason: right wrist laceration, numbness in right thumb  Duration of symptoms: since 02/08/2024 Hand dominance: right Occupation: Games developer Diabetic: No Smoking: former, uses [facility] History: none Blood Thinners: none  Prior Testing/EMG: x-rays Injections (Date): none Treatments: sutures Prior Surgery: none  Assessment & Plan: Visit Diagnoses:  1. 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 Laceration of digital nerve of right thumb, initial encounter     Plan: Extensive discussion was had with the patient today regarding his right wrist laceration injury.  We discussed the underlying anatomy in this region that may have been disrupted, including the dorsal radial sensory nerve in particular.  He does have some ongoing numbness throughout the distal aspect of the thumb, positive Tinel's over the [facility] region with radiating pain up the thumb as well.  From from a conservative standpoint, we  discussed ongoing observation to allow for nerve regeneration and healing.  We discussed the possibility of neuroma formation should the nerve have been traumatized, which it does appear it has been based on his clinical examination.  From a surgical standpoint, we discussed wound exploration, debridement of any devitalized tissue and exploration of the dorsal radial sensory nerve in particular to look for any significant injury that requires repair or neurolysis.  Risks and benefits of the procedure were discussed, risks including but not limited to infection, bleeding, scarring, stiffness, nerve injury, tendon injury, vascular injury, persistent numbness or dysfunction, recurrence of symptoms and need for subsequent operation.  We also discussed the appropriate postoperative protocol and timeframe for return to activities and function.  Patient expressed understanding.  He would like to proceed with surgical intervention as discussed at the next available date.  Follow-up: No follow-ups on file.   Meds & Orders: No orders of the defined types were placed in this encounter.  No orders of the defined types were placed in this encounter.    Procedures: No procedures performed      Clinical History: No specialty comments available.  He reports that he has been smoking e-cigarettes and cigarettes. He has never used smokeless tobacco. No results for input(s): HGBA1C, LABURIC in the last 8760 hours.  Objective:   Vital Signs: There were no vitals taken for this visit.  Physical Exam  Gen: Well-appearing, in no acute distress; non-toxic CV: Regular Rate. Well-perfused. Warm.  Resp: Breathing unlabored on room air; no wheezing. Psych: Fluid speech in conversation; appropriate affect; normal thought process  Ortho Exam Left wrist: - 2 cm laceration over the dorsal radial aspect of  the wrist, glabrous/nonglabrous border near the level of the [facility] region - Positive Tinel's over the laceration  site, radiating pain along the dorsal radial sensory nerve distribution volarly - Range of motion of the thumb is preserved, abduction is preserved, IP flexion extension fully preserved - Thumb remains warm well-perfused, normal color and capillary refill  Imaging: No results found.  Past Medical/Family/Surgical/Social History: Medications & Allergies reviewed per EMR, new medications updated. There are no active problems to display for this patient.  No past medical history on file. No family history on file. No past surgical history on file. Social History   Occupational History   Not on file  Tobacco Use   Smoking status: Some Days    Current packs/day: 0.50    Types: E-cigarettes, Cigarettes   Smokeless tobacco: Never  Substance and Sexual Activity   Alcohol use: No   Drug use: No   Sexual activity: Not on file    Jose Ho) Arlinda, M.D. [facility], Hand Surgery

## 2024-02-27 ENCOUNTER — Other Ambulatory Visit: Payer: Self-pay | Admitting: Orthopedic Surgery

## 2024-03-02 ENCOUNTER — Encounter (HOSPITAL_BASED_OUTPATIENT_CLINIC_OR_DEPARTMENT_OTHER): Payer: Self-pay | Admitting: Orthopedic Surgery

## 2024-03-02 NOTE — Anesthesia Preprocedure Evaluation (Signed)
 Anesthesia Evaluation  Patient identified by MRN, date of birth, ID band Patient awake    Reviewed: Allergy & Precautions, NPO status , Patient's Chart, lab work & pertinent test results  Airway Mallampati: II       Dental no notable dental hx. (+) Teeth Intact, Dental Advisory Given   Pulmonary former smoker   Pulmonary exam normal breath sounds clear to auscultation       Cardiovascular negative cardio ROS Normal cardiovascular exam Rhythm:Regular Rate:Normal     Neuro/Psych   Anxiety     negative neurological ROS     GI/Hepatic negative GI ROS, Neg liver ROS,,,  Endo/Other  negative endocrine ROS    Renal/GU negative Renal ROS  negative genitourinary   Musculoskeletal Right wrist laceration   Abdominal   Peds  Hematology negative hematology ROS (+)   Anesthesia Other Findings   Reproductive/Obstetrics                              Anesthesia Physical Anesthesia Plan  ASA: 2  Anesthesia Plan: General   Post-op Pain Management: Minimal or no pain anticipated   Induction: Intravenous  PONV Risk Score and Plan: 3 and Treatment may vary due to age or medical condition, Ondansetron  and Dexamethasone  Airway Management Planned: LMA  Additional Equipment: None  Intra-op Plan:   Post-operative Plan: Extubation in OR  Informed Consent: I have reviewed the patients History and Physical, chart, labs and discussed the procedure including the risks, benefits and alternatives for the proposed anesthesia with the patient or authorized representative who has indicated his/her understanding and acceptance.     Dental advisory given  Plan Discussed with: CRNA and Anesthesiologist  Anesthesia Plan Comments:          Anesthesia Quick Evaluation

## 2024-03-03 ENCOUNTER — Encounter (HOSPITAL_BASED_OUTPATIENT_CLINIC_OR_DEPARTMENT_OTHER)
Admission: RE | Disposition: A | Discharge status: Home / Self Care | Payer: Self-pay | Source: Home / Self Care | Attending: Orthopedic Surgery

## 2024-03-03 ENCOUNTER — Encounter (HOSPITAL_BASED_OUTPATIENT_CLINIC_OR_DEPARTMENT_OTHER): Payer: Self-pay | Admitting: Orthopedic Surgery

## 2024-03-03 ENCOUNTER — Ambulatory Visit (HOSPITAL_BASED_OUTPATIENT_CLINIC_OR_DEPARTMENT_OTHER)
Admission: RE | Admit: 2024-03-03 | Discharge: 2024-03-03 | Disposition: A | Discharge status: Home / Self Care | Attending: Orthopedic Surgery | Admitting: Orthopedic Surgery

## 2024-03-03 ENCOUNTER — Other Ambulatory Visit: Payer: Self-pay

## 2024-03-03 ENCOUNTER — Ambulatory Visit (HOSPITAL_BASED_OUTPATIENT_CLINIC_OR_DEPARTMENT_OTHER): Payer: Self-pay | Admitting: Anesthesiology

## 2024-03-03 DIAGNOSIS — X58XXXA Exposure to other specified factors, initial encounter: Secondary | ICD-10-CM | POA: Diagnosis not present

## 2024-03-03 DIAGNOSIS — Z01818 Encounter for other preprocedural examination: Secondary | ICD-10-CM

## 2024-03-03 DIAGNOSIS — S4451XA Injury of cutaneous sensory nerve at shoulder and upper arm level, right arm, initial encounter: Secondary | ICD-10-CM

## 2024-03-03 DIAGNOSIS — S61511A Laceration without foreign body of right wrist, initial encounter: Secondary | ICD-10-CM | POA: Insufficient documentation

## 2024-03-03 DIAGNOSIS — S6422XD Injury of radial nerve at wrist and hand level of left arm, subsequent encounter: Secondary | ICD-10-CM | POA: Diagnosis not present

## 2024-03-03 DIAGNOSIS — F17290 Nicotine dependence, other tobacco product, uncomplicated: Secondary | ICD-10-CM | POA: Diagnosis not present

## 2024-03-03 DIAGNOSIS — F17210 Nicotine dependence, cigarettes, uncomplicated: Secondary | ICD-10-CM | POA: Diagnosis not present

## 2024-03-03 HISTORY — PX: NERVE REPAIR: SHX2083

## 2024-03-03 HISTORY — DX: Anxiety disorder, unspecified: F41.9

## 2024-03-03 HISTORY — PX: WOUND EXPLORATION: SHX6188

## 2024-03-03 SURGERY — WOUND EXPLORATION
Anesthesia: General | Site: Wrist | Laterality: Right

## 2024-03-03 MED ORDER — 0.9 % SODIUM CHLORIDE (POUR BTL) OPTIME
TOPICAL | Status: DC | PRN
  Administered 2024-03-03: 300 mL

## 2024-03-03 MED ORDER — DEXMEDETOMIDINE HCL IN NACL 80 MCG/20ML IV SOLN
INTRAVENOUS | Status: AC
  Filled 2024-03-03: qty 20

## 2024-03-03 MED ORDER — DEXAMETHASONE SODIUM PHOSPHATE 10 MG/ML IJ SOLN
INTRAMUSCULAR | Status: AC
  Filled 2024-03-03: qty 1

## 2024-03-03 MED ORDER — ONDANSETRON HCL 4 MG/2ML IJ SOLN: 4.0000 mg | Freq: Once | INTRAMUSCULAR | Status: DC | PRN

## 2024-03-03 MED ORDER — CEFAZOLIN SODIUM-DEXTROSE 2-4 GM/100ML-% IV SOLN
2.0000 g | INTRAVENOUS | Status: AC
  Administered 2024-03-03: 2 g via INTRAVENOUS

## 2024-03-03 MED ORDER — CEFAZOLIN SODIUM-DEXTROSE 2-4 GM/100ML-% IV SOLN
INTRAVENOUS | Status: AC
  Filled 2024-03-03: qty 100

## 2024-03-03 MED ORDER — OXYCODONE HCL 5 MG PO TABS
ORAL_TABLET | ORAL | Status: AC
  Filled 2024-03-03: qty 1

## 2024-03-03 MED ORDER — OXYCODONE HCL 5 MG/5ML PO SOLN: 5.0000 mg | Freq: Once | ORAL | Status: AC | PRN

## 2024-03-03 MED ORDER — FENTANYL CITRATE (PF) 100 MCG/2ML IJ SOLN
INTRAMUSCULAR | Status: DC | PRN
  Administered 2024-03-03: 100 ug via INTRAVENOUS

## 2024-03-03 MED ORDER — MIDAZOLAM HCL 5 MG/5ML IJ SOLN
INTRAMUSCULAR | Status: DC | PRN
  Administered 2024-03-03: 2 mg via INTRAVENOUS

## 2024-03-03 MED ORDER — LACTATED RINGERS IV SOLN: INTRAVENOUS | Status: DC | PRN

## 2024-03-03 MED ORDER — LIDOCAINE-EPINEPHRINE 1 %-1:100000 IJ SOLN
INTRAMUSCULAR | Status: AC
  Filled 2024-03-03: qty 1

## 2024-03-03 MED ORDER — HYDROMORPHONE HCL 1 MG/ML IJ SOLN: 0.2500 mg | INTRAMUSCULAR | Status: DC | PRN

## 2024-03-03 MED ORDER — ACETAMINOPHEN 10 MG/ML IV SOLN
INTRAVENOUS | Status: DC | PRN
  Administered 2024-03-03: 1000 mg via INTRAVENOUS

## 2024-03-03 MED ORDER — GLYCOPYRROLATE PF 0.2 MG/ML IJ SOSY
PREFILLED_SYRINGE | INTRAMUSCULAR | Status: AC
  Filled 2024-03-03: qty 1

## 2024-03-03 MED ORDER — PROPOFOL 500 MG/50ML IV EMUL
INTRAVENOUS | Status: DC | PRN
  Administered 2024-03-03: 100 ug/kg/min via INTRAVENOUS

## 2024-03-03 MED ORDER — DEXMEDETOMIDINE HCL IN NACL 80 MCG/20ML IV SOLN
INTRAVENOUS | Status: DC | PRN
  Administered 2024-03-03 (×2): 8 ug via INTRAVENOUS

## 2024-03-03 MED ORDER — PHENYLEPHRINE 80 MCG/ML (10ML) SYRINGE FOR IV PUSH (FOR BLOOD PRESSURE SUPPORT)
PREFILLED_SYRINGE | INTRAVENOUS | Status: AC
  Filled 2024-03-03: qty 10

## 2024-03-03 MED ORDER — PROPOFOL 10 MG/ML IV BOLUS
INTRAVENOUS | Status: DC | PRN
  Administered 2024-03-03: 200 mg via INTRAVENOUS

## 2024-03-03 MED ORDER — LIDOCAINE-EPINEPHRINE 1 %-1:100000 IJ SOLN
INTRAMUSCULAR | Status: DC | PRN
  Administered 2024-03-03: 10 mL

## 2024-03-03 MED ORDER — PHENYLEPHRINE 80 MCG/ML (10ML) SYRINGE FOR IV PUSH (FOR BLOOD PRESSURE SUPPORT)
PREFILLED_SYRINGE | INTRAVENOUS | Status: DC | PRN
  Administered 2024-03-03: 80 ug via INTRAVENOUS

## 2024-03-03 MED ORDER — LACTATED RINGERS IV SOLN: INTRAVENOUS | Status: DC

## 2024-03-03 MED ORDER — LIDOCAINE 2% (20 MG/ML) 5 ML SYRINGE
INTRAMUSCULAR | Status: DC | PRN
  Administered 2024-03-03: 60 mg via INTRAVENOUS

## 2024-03-03 MED ORDER — MIDAZOLAM HCL 2 MG/2ML IJ SOLN
INTRAMUSCULAR | Status: AC
  Filled 2024-03-03: qty 2

## 2024-03-03 MED ORDER — KETOROLAC TROMETHAMINE 30 MG/ML IJ SOLN
INTRAMUSCULAR | Status: AC
  Filled 2024-03-03: qty 1

## 2024-03-03 MED ORDER — OXYCODONE HCL 5 MG PO TABS
5.0000 mg | ORAL_TABLET | Freq: Once | ORAL | Status: AC | PRN
  Administered 2024-03-03: 5 mg via ORAL

## 2024-03-03 MED ORDER — PROPOFOL 1000 MG/100ML IV EMUL
INTRAVENOUS | Status: AC
  Filled 2024-03-03: qty 200

## 2024-03-03 MED ORDER — ONDANSETRON HCL 4 MG/2ML IJ SOLN
INTRAMUSCULAR | Status: DC | PRN
  Administered 2024-03-03: 4 mg via INTRAVENOUS

## 2024-03-03 MED ORDER — OXYCODONE HCL 5 MG PO TABS: 5.0000 mg | ORAL_TABLET | Freq: Four times a day (QID) | ORAL | 0 refills | Status: DC | PRN

## 2024-03-03 MED ORDER — KETOROLAC TROMETHAMINE 30 MG/ML IJ SOLN
INTRAMUSCULAR | Status: DC | PRN
  Administered 2024-03-03: 30 mg via INTRAVENOUS

## 2024-03-03 MED ORDER — FENTANYL CITRATE (PF) 100 MCG/2ML IJ SOLN
INTRAMUSCULAR | Status: AC
  Filled 2024-03-03: qty 2

## 2024-03-03 MED ORDER — DROPERIDOL 2.5 MG/ML IJ SOLN: 0.6250 mg | Freq: Once | INTRAMUSCULAR | Status: DC | PRN

## 2024-03-03 MED ORDER — LIDOCAINE 2% (20 MG/ML) 5 ML SYRINGE
INTRAMUSCULAR | Status: AC
  Filled 2024-03-03: qty 5

## 2024-03-03 MED ORDER — GLYCOPYRROLATE 0.2 MG/ML IJ SOLN
INTRAMUSCULAR | Status: DC | PRN
  Administered 2024-03-03: .2 mg via INTRAVENOUS

## 2024-03-03 SURGICAL SUPPLY — 65 items
BLADE ARTHRO LOK 4 BEAVER (BLADE) ×1 IMPLANT
BLADE AVERAGE 25X9 (BLADE) IMPLANT
BLADE MINI RND TIP GREEN BEAV (BLADE) IMPLANT
BLADE SURG 11 STRL SS (BLADE) IMPLANT
BLADE SURG 15 STRL LF DISP TIS (BLADE) ×2 IMPLANT
BNDG COHESIVE 4X5 TAN STRL LF (GAUZE/BANDAGES/DRESSINGS) ×1 IMPLANT
BNDG COMPR ESMARK 4X3 LF (GAUZE/BANDAGES/DRESSINGS) ×1 IMPLANT
BNDG ELASTIC 3INX 5YD STR LF (GAUZE/BANDAGES/DRESSINGS) IMPLANT
BNDG ELASTIC 4INX 5YD STR LF (GAUZE/BANDAGES/DRESSINGS) ×1 IMPLANT
BNDG GAUZE DERMACEA FLUFF 4 (GAUZE/BANDAGES/DRESSINGS) ×1 IMPLANT
BRUSH SCRUB EZ PLAIN DRY (MISCELLANEOUS) IMPLANT
CATH ROBINSON RED A/P 10FR (CATHETERS) IMPLANT
CATH ROBINSON RED A/P 8FR (CATHETERS) IMPLANT
CHLORAPREP W/TINT 26 (MISCELLANEOUS) ×2 IMPLANT
CORD BIPOLAR FORCEPS 12FT (ELECTRODE) ×1 IMPLANT
COVER BACK TABLE 60X90IN (DRAPES) ×1 IMPLANT
CUFF TOURN SGL QUICK 18X4 (TOURNIQUET CUFF) IMPLANT
DRAPE HAND 77X146 (DRAPES) ×1 IMPLANT
DRAPE IMP U-DRAPE 54X76 (DRAPES) ×1 IMPLANT
DRAPE OEC MINIVIEW 54X84 (DRAPES) ×1 IMPLANT
DRAPE SURG 17X23 STRL (DRAPES) ×1 IMPLANT
GAUZE PAD ABD 8X10 STRL (GAUZE/BANDAGES/DRESSINGS) ×1 IMPLANT
GAUZE SPONGE 4X4 12PLY STRL (GAUZE/BANDAGES/DRESSINGS) ×1 IMPLANT
GAUZE STRETCH 2X75IN STRL (MISCELLANEOUS) ×1 IMPLANT
GAUZE XEROFORM 1X8 LF (GAUZE/BANDAGES/DRESSINGS) ×1 IMPLANT
GLOVE BIO SURGEON STRL SZ7.5 (GLOVE) ×1 IMPLANT
GLOVE BIOGEL PI IND STRL 7.0 (GLOVE) IMPLANT
GLOVE BIOGEL PI IND STRL 7.5 (GLOVE) ×1 IMPLANT
GLOVE ECLIPSE 6.5 STRL STRAW (GLOVE) IMPLANT
GOWN STRL REUS W/ TWL LRG LVL3 (GOWN DISPOSABLE) ×2 IMPLANT
GOWN STRL REUS W/TWL XL LVL3 (GOWN DISPOSABLE) IMPLANT
GOWN STRL SURGICAL XL XLNG (GOWN DISPOSABLE) ×1 IMPLANT
LOOP VASCLR MAXI BLUE 18IN ST (MISCELLANEOUS) IMPLANT
MANIFOLD NEPTUNE II (INSTRUMENTS) ×1 IMPLANT
NDL HYPO 25X1 1.5 SAFETY (NEEDLE) IMPLANT
NDL HYPO 25X5/8 SAFETYGLIDE (NEEDLE) IMPLANT
NDL SUT 6 .5 CRC .975X.05 MAYO (NEEDLE) IMPLANT
NEEDLE HYPO 25X1 1.5 SAFETY (NEEDLE) IMPLANT
NEEDLE HYPO 25X5/8 SAFETYGLIDE (NEEDLE) IMPLANT
NS IRRIG 1000ML POUR BTL (IV SOLUTION) IMPLANT
PACK BASIN DAY SURGERY FS (CUSTOM PROCEDURE TRAY) ×1 IMPLANT
PAD CAST 3X4 CTTN HI CHSV (CAST SUPPLIES) ×1 IMPLANT
SHEET MEDIUM DRAPE 40X70 STRL (DRAPES) ×1 IMPLANT
SPEAR EYE SURG WECK-CEL (MISCELLANEOUS) IMPLANT
SPIKE FLUID TRANSFER (MISCELLANEOUS) IMPLANT
SPLINT PLASTER CAST XFAST 4X15 (CAST SUPPLIES) ×1 IMPLANT
SPONGE SURGIFOAM ABS GEL 12-7 (HEMOSTASIS) IMPLANT
STOCKINETTE IMPERVIOUS 9X36 MD (GAUZE/BANDAGES/DRESSINGS) ×1 IMPLANT
SUCTION TUBE FRAZIER 10FR DISP (SUCTIONS) IMPLANT
SUT CHROMIC 3 0 SH 27 (SUTURE) IMPLANT
SUT ETHIBOND 0 MO6 C/R (SUTURE) IMPLANT
SUT ETHILON 4 0 PS 2 18 (SUTURE) IMPLANT
SUT ETHILON 8 0 BV130 4 (SUTURE) IMPLANT
SUT MNCRL AB 3-0 PS2 27 (SUTURE) IMPLANT
SUT MNCRL AB 4-0 PS2 18 (SUTURE) IMPLANT
SUT NYLON 9 0 VRM6 (SUTURE) IMPLANT
SUT PDS AB 4-0 RB1 27 (SUTURE) IMPLANT
SUT SILK 4 0 PS 2 (SUTURE) IMPLANT
SUT VIC AB 3-0 PS2 18XBRD (SUTURE) IMPLANT
SYR BULB EAR ULCER 3OZ GRN STR (SYRINGE) ×2 IMPLANT
SYR CONTROL 10ML LL (SYRINGE) IMPLANT
TOWEL GREEN STERILE FF (TOWEL DISPOSABLE) ×2 IMPLANT
TRAY DSU PREP LF (CUSTOM PROCEDURE TRAY) IMPLANT
TUBE CONNECTING 20X1/4 (TUBING) IMPLANT
UNDERPAD 30X36 HEAVY ABSORB (UNDERPADS AND DIAPERS) ×1 IMPLANT

## 2024-03-03 NOTE — Anesthesia Postprocedure Evaluation (Signed)
 Anesthesia Post Note  Patient: Jose Ho  Procedure(s) Performed: RIGHT WRIST WOUND EXPLORATION (Right: Wrist) DORSAL RADIAL SENSORY NERVE REPAIR (Right: Wrist)     Patient location during evaluation: PACU Anesthesia Type: General Level of consciousness: awake and alert and oriented Pain management: pain level controlled Vital Signs Assessment: post-procedure vital signs reviewed and stable Respiratory status: spontaneous breathing, nonlabored ventilation and respiratory function stable Cardiovascular status: blood pressure returned to baseline and stable Postop Assessment: no apparent nausea or vomiting Anesthetic complications: no   No notable events documented.  Last Vitals:  Vitals:   03/03/24 0845 03/03/24 0900  BP: (!) 86/53 (!) 88/49  Pulse: 60 60  Resp: 13 13  Temp:    SpO2: 98% 98%    Last Pain:  Vitals:   03/03/24 0900  TempSrc:   PainSc: Asleep                 [name] A.

## 2024-03-03 NOTE — Transfer of Care (Signed)
 Immediate Anesthesia Transfer of Care Note  Patient: Jose Ho  Procedure(s) Performed: RIGHT WRIST WOUND EXPLORATION (Right: Wrist) DORSAL RADIAL SENSORY NERVE REPAIR (Right: Wrist)  Patient Location: PACU  Anesthesia Type:General  Level of Consciousness: drowsy and responds to stimulation  Airway & Oxygen Therapy: Patient Spontanous Breathing and Patient connected to face mask oxygen  Post-op Assessment: Report given to RN and Post -op Vital signs reviewed and stable  Post vital signs: Reviewed and stable  Last Vitals:  Vitals Value Taken Time  BP 93/50 03/03/24 08:31  Temp 36.2 C 03/03/24 08:31  Pulse 65 03/03/24 08:34  Resp 13 03/03/24 08:34  SpO2 98 % 03/03/24 08:34  Vitals shown include unfiled device data.  Last Pain:  Vitals:   03/03/24 0705  TempSrc: Temporal  PainSc: 0-No pain      Patients Stated Pain Goal: 4 (03/03/24 0705)  Complications: No notable events documented.

## 2024-03-03 NOTE — Interval H&P Note (Signed)
 History and Physical Interval Note:  03/03/2024 6:32 AM  Jose Ho  has presented today for surgery, with the diagnosis of RIGHT WRIST LACERATION WITH POSSIBLE DORSAL RADIAL SENSORY NERVE INJURY, POSSIBLE DIGITAL NERVE REPAIR.  The various methods of treatment have been discussed with the patient and family. After consideration of risks, benefits and other options for treatment, the patient has consented to  Procedure(s) with comments: WOUND EXPLORATION (Right) - RIGHT WRIST WOUND EXPLORATION, POSSIBLE RADIAL SENSORY NERVE NEUROLYSIS VS REPAIR/ POSSIBLE DIGITAL NERVE REPAIR REPAIR, NERVE (Right) as a surgical intervention.  The patient's history has been reviewed, patient examined, no change in status, stable for surgery.  I have reviewed the patient's chart and labs.  Questions were answered to the patient's satisfaction.     [name]

## 2024-03-03 NOTE — Discharge Instructions (Addendum)
    Hand Surgery Postop Instructions   Dressings: Maintain postoperative dressing until orthopedic follow-up.  Keep operative site clean and dry until orthopedic follow-up.  Wound Care: Keep your hand elevated above the level of your heart.  Do not allow it to dangle by your side. Moving your fingers is advised to stimulate circulation but will depend on the site of your surgery.  If you have a splint applied, your doctor will advise you regarding movement.  Activity: Do not drive or operate machinery until clearance given from physician. No heavy lifting with operative extremity.  Diet:  Drink liquids today or eat a light diet.  You may resume a regular diet tomorrow.    General expectations: Take prescribed medication if given, transition to over-the-counter medication as quickly as possible. Fingers may become slightly swollen.  Call your doctor if any of the following occur: Severe pain not relieved by pain medication. Elevated temperature. Dressing soaked with blood. Inability to move fingers. White or bluish color to fingers.   Per [facility] clinic policy, our goal is ensure optimal postoperative pain control with a multimodal pain management strategy. For all [facility] patients, our goal is to wean post-operative narcotic medications by 6 weeks post-operatively. If this is not possible due to utilization of pain medication prior to surgery, your [facility] doctor will support your acute post-operative pain control for the first 6 weeks postoperatively, with a plan to transition you back to your primary pain team following that. Maralee will work to ensure a Therapist, occupational.  Anshul Afton Alderton, M.D. Hand Surgery [facility]  No Tylenol  before 2pm today. No NSAIDS (Ibuprofen /Motrin Jeronimo) before 4pm today.   Post Anesthesia Home Care Instructions  Activity: Get plenty of rest for the remainder of the day. A responsible individual must stay with you for  24 hours following the procedure.  For the next 24 hours, DO NOT: -Drive a car -Advertising copywriter -Drink alcoholic beverages -Take any medication unless instructed by your physician -Make any legal decisions or sign important papers.  Meals: Start with liquid foods such as gelatin or soup. Progress to regular foods as tolerated. Avoid greasy, spicy, heavy foods. If nausea and/or vomiting occur, drink only clear liquids until the nausea and/or vomiting subsides. Call your physician if vomiting continues.  Special Instructions/Symptoms: Your throat may feel dry or sore from the anesthesia or the breathing tube placed in your throat during surgery. If this causes discomfort, gargle with warm salt water. The discomfort should disappear within 24 hours.  If you had a scopolamine patch placed behind your ear for the management of post- operative nausea and/or vomiting:  1. The medication in the patch is effective for 72 hours, after which it should be removed.  Wrap patch in a tissue and discard in the trash. Wash hands thoroughly with soap and water. 2. You may remove the patch earlier than 72 hours if you experience unpleasant side effects which may include dry mouth, dizziness or visual disturbances. 3. Avoid touching the patch. Wash your hands with soap and water after contact with the patch.

## 2024-03-03 NOTE — Anesthesia Procedure Notes (Signed)
 Procedure Name: LMA Insertion Date/Time: 03/03/2024 7:36 AM  Performed by: Denton Niels CROME, CRNAPre-anesthesia Checklist: Patient identified, Emergency Drugs available, Suction available, Patient being monitored and Timeout performed Patient Re-evaluated:Patient Re-evaluated prior to induction Oxygen Delivery Method: Circle system utilized Preoxygenation: Pre-oxygenation with 100% oxygen Induction Type: IV induction Ventilation: Mask ventilation without difficulty LMA: LMA inserted LMA Size: 4.0 Number of attempts: 1 Placement Confirmation: positive ETCO2 Dental Injury: Teeth and Oropharynx as per pre-operative assessment

## 2024-03-03 NOTE — Op Note (Signed)
 NAME: Jose Ho MEDICAL RECORD NO: [id number] DATE OF BIRTH: 05/28/2001 FACILITY: [facility] LOCATION: [facility] PHYSICIAN: GILDARDO ALDERTON, MD   OPERATIVE REPORT   DATE OF PROCEDURE: 03/03/24    PREOPERATIVE DIAGNOSIS: Right wrist laceration with possible dorsal radial sensory nerve injury   POSTOPERATIVE DIAGNOSIS: Right wrist laceration with dorsal right sensory nerve laceration   PROCEDURE: Right wrist penetrating wound exploration Right wrist dorsal radial sensory nerve repair    SURGEON:  GILDARDO ALDERTON, M.D.   ASSISTANT: Joesph Dinsmore, OPA   ANESTHESIA:  General   INTRAVENOUS FLUIDS:  Per anesthesia flow sheet.   ESTIMATED BLOOD LOSS:  Minimal.   COMPLICATIONS:  None.   SPECIMENS:  none   TOURNIQUET TIME:    Total Tourniquet Time Documented: Upper Arm (Right) - 15 minutes Total: Upper Arm (Right) - 15 minutes    DISPOSITION:  Stable to PACU.   INDICATIONS: 23 year old male who sustained a penetrating injury right wrist laceration with associated possible radial sensory nerve injury.  Patient was indicated for right wrist exploration, debridement and possible nerve repair.  Risks and benefits of surgery were discussed including the risks of infection, bleeding, scarring, stiffness, nerve injury, vascular injury, tendon injury, need for subsequent operation, persistent numbness, need for repeat irrigation and debridement.  He voiced understanding of these risks and elected to proceed.  OPERATIVE COURSE: Patient was seen and identified in the preoperative area and marked appropriately.  Surgical consent had been signed. Preoperative IV antibiotic prophylaxis was given. He was transferred to the operating room and placed in supine position with the right upper extremity on an arm board.  General anesthesia was induced by the anesthesiologist.  Right upper extremity was prepped and draped in normal sterile orthopedic fashion.  A surgical pause was  performed between the surgeons, anesthesia, and operating room staff and all were in agreement as to the patient, procedure, and site of procedure.  Tourniquet was placed and padded appropriately to the right upper arm.  The arm exsanguinated the tourniquet was inflated to 250 mmHg.  Prior laceration site was reopened bluntly over the radial aspect of the wrist.  Prior laceration site measured approximately 2 cm in length in transverse fashion.  Laceration site was extended both proximally and distally in longitudinal fashion in order to expose the underlying neurovascular structures.  Skin flaps were elevated, careful dissection was performed down to to identify the radial artery and the dorsal radial sensory nerve.  No significant injury to the vascular structures was seen, careful dissection was performed down around the radial artery as well as the dorsal branch.  There was notable injury to branch of the the dorsal radial sensory nerve with a transverse laceration consistent with the preoperative exam.  This appeared to be a volar branch of the dorsal radial sensory nerve.  The nerve ends were identified, the stumps were resected back just slightly to allow for healthy nerve tissue to be reapproximated.  Repair was performed without undue tension, 8-0 nylon was utilized in simple standard fashion.  Loupe magnification and microsurgical instruments were utilized.  Once repair was completed, wrist range of motion and thumb range of motion was performed to ensure adequacy of repair.  The tourniquet was deflated at 15 minutes.  Fingertips were pink with brisk capillary refill after deflation of tourniquet.  Copious irrigation was performed of the wound.  Wound was closed in layers utilizing combination of 3-0 Monocryl and 4-0 nylon for the skin surface.  10 cc of  1% lidocaine  with epinephrine  was utilized for the incisional site for postoperative pain control.  Sterile dressings were applied followed by  application of a thumb spica splint utilizing plaster.  The operative drapes were broken down.  The patient was awoken from anesthesia safely and taken to PACU in stable condition.   Post-operative plan: The patient will recover in the post-anesthesia care unit and then be discharged home.  The patient will be non weight bearing on the right upper extremity in a thumb spica splint.   I will see the patient back in the office in 2 weeks for postoperative followup.    Amdrew Oboyle, MD Electronically signed, 03/03/24

## 2024-03-04 ENCOUNTER — Encounter (HOSPITAL_BASED_OUTPATIENT_CLINIC_OR_DEPARTMENT_OTHER): Payer: Self-pay | Admitting: Orthopedic Surgery

## 2024-03-11 ENCOUNTER — Ambulatory Visit (INDEPENDENT_AMBULATORY_CARE_PROVIDER_SITE_OTHER): Admitting: Orthopedic Surgery

## 2024-03-11 ENCOUNTER — Other Ambulatory Visit: Payer: Self-pay | Admitting: Orthopedic Surgery

## 2024-03-11 ENCOUNTER — Telehealth: Payer: Self-pay | Admitting: Orthopedic Surgery

## 2024-03-11 DIAGNOSIS — S648X1A Injury of other nerves at wrist and hand level of right arm, initial encounter: Secondary | ICD-10-CM

## 2024-03-11 MED ORDER — OXYCODONE HCL 5 MG PO TABS: 5.0000 mg | ORAL_TABLET | Freq: Four times a day (QID) | ORAL | 0 refills | Status: DC | PRN

## 2024-03-11 NOTE — Telephone Encounter (Signed)
 Patient called and said that he was waiting on his pain medication to filled. [phone]

## 2024-03-11 NOTE — Progress Notes (Unsigned)
   Donnice JENEANE Amis - 23 y.o. male MRN [id number]  Date of birth: [birth year]  Office Visit Note: Visit Date: 03/11/2024 PCP: Marvine Rush, MD Referred by: Marvine Rush, MD  Subjective:  HPI: JAN OLANO is a 23 y.o. male who presents today for follow up 1 weeks status post right wrist penetrating wound exploration and dorsal radial sensory nerve repair.  Discussed the use of AI scribe software for clinical note transcription with the patient, who gave verbal consent to proceed.      Pertinent ROS were reviewed with the patient and found to be negative unless otherwise specified above in HPI.   Assessment & Plan: Visit Diagnoses: No diagnosis found.  Plan: ***  Assessment and Plan      Follow-up: No follow-ups on file.   Meds & Orders: No orders of the defined types were placed in this encounter.  No orders of the defined types were placed in this encounter.    Procedures: No procedures performed       Objective:   Vital Signs: There were no vitals taken for this visit.    Ortho Exam ***  Imaging: No results found.   Anshul Afton Alderton, M.D. [facility], Hand Surgery

## 2024-03-16 NOTE — Therapy (Signed)
 OUTPATIENT OCCUPATIONAL THERAPY ORTHO EVALUATION  Patient Name: Jose Ho MRN: [id number] DOB:2001/02/06, 23 y.o., male Today's Date: 03/17/2024  PCP: [facility] MD REFERRING PROVIDER:  Arlinda Buster, MD    END OF SESSION:  OT End of Session - 03/17/24 1017     Visit Number 1    Number of Visits 10    Date for Recertification  05/01/24    Authorization Type [employer]    OT Start Time 1017    OT Stop Time 1107    OT Time Calculation (min) 50 min    Equipment Utilized During Treatment orthotic materials    Activity Tolerance Patient tolerated treatment well;No increased pain;Patient limited by fatigue;Patient limited by pain    Behavior During Therapy [facility] for tasks assessed/performed          Past Medical History:  Diagnosis Date   Anxiety    Past Surgical History:  Procedure Laterality Date   MOUTH SURGERY     NERVE REPAIR Right 03/03/2024   Procedure: DORSAL RADIAL SENSORY NERVE REPAIR;  Surgeon: Arlinda Buster, MD;  Location: [facility];  Service: Orthopedics;  Laterality: Right;   WOUND EXPLORATION Right 03/03/2024   Procedure: RIGHT WRIST WOUND EXPLORATION;  Surgeon: Arlinda Buster, MD;  Location:  SURGERY CENTER;  Service: Orthopedics;  Laterality: Right;   Patient Active Problem List   Diagnosis Date Noted   Laceration of skin and cutaneous sensory nerve of right upper extremity 03/03/2024    ONSET DATE: DOS 03/03/24  REFERRING DIAG: D35.68KJ (ICD-10-CM) - Laceration of digital nerve of right thumb, initial encounter   THERAPY DIAG:  Localized edema  Muscle weakness (generalized)  Paresthesia of skin  Pain in right wrist  Rationale for Evaluation and Treatment: Rehabilitation  SUBJECTIVE:   SUBJECTIVE STATEMENT: Now 2 weeks status post right dorsal radial sensory nerve repair. The original injury took place approx 3 weeks ago.   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   He states that I want to do this correctly, because I was rushed through a prior leg injury and now I have back pain chronically.  However, he does admit to trying to lift things while in his cast, which is inappropriate.  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 Yes: NPRS scale: Pain fluctuates from 0-1/10 at rest to sudden jolts of pain up to 7/10 Pain location: Right wrist and surgical area Pain description: Sharp jolts of pain Aggravating factors: Unsure Relieving factors: Unsure  FALLS: Has patient fallen in last 6 months? No  PLOF: Independent  PATIENT GOALS: To safely improve the use of his right hand and arm, regain sensory function and strength.  NEXT MD VISIT: 04/14/24   OBJECTIVE: (All objective assessments below are from initial evaluation on: 03/17/24 unless otherwise specified.)   HAND DOMINANCE: Right   ADLs: Overall ADLs: States decreased ability to grab, hold household objects, pain and difficulty to open containers, perform FMS tasks (manipulate fasteners on clothing), perform work tasks, mild to moderate bathing problems as well.    FUNCTIONAL OUTCOME MEASURES: Eval: Patient Specific Functional Scale: TBD in the next session as time allows (Higher Score  =  Better Ability for the Selected  Tasks)      UPPER EXTREMITY ROM     Shoulder to Wrist AROM Right eval  Elbow flexion WFL  Elbow extension WFL  Forearm supination ~65  Forearm pronation  ~75  Wrist flexion NT  Wrist extension NT  Wrist ulnar deviation   Wrist radial deviation   Functional dart thrower's motion (F-DTM) in ulnar flexion   F-DTM in radial extension    (Blank rows = not tested)   Hand AROM Right eval  Full Fist Ability (or Gap to Distal Palmar Crease)  Unable due to overtly stiff and swollen fingers, and thumb  Thumb Opposition  (Kapandji Scale)  4/10  Thumb MCP (0-60) TBD  Thumb IP (0-80)   Thumb Radial Abduction Span   Thumb Palmar Abduction Span   (Blank rows = not tested)   UPPER EXTREMITY MMT:    Eval:  NT at eval due to recent and still healing injuries. Will be tested when appropriate.   MMT Right TBD  Elbow flexion   Elbow extension   Forearm supination   Forearm pronation   Wrist flexion   Wrist extension   Wrist ulnar deviation   Wrist radial deviation   (Blank rows = not tested)  HAND FUNCTION: Eval: Observed weakness in affected Rt hand.  Details TBD when safe Grip strength Right: TBD lbs, Left: TBD lbs   COORDINATION: Eval: Observed coordination impairments with affected right hand.  Details TBD when safe Box and Blocks Test: Rt: TBD Blocks today (TBD is WFL)   SENSATION: Eval:  Light touch intact today, Semmes Weinstein monofilament test does test 2.83 all around the thumb volarly and dorsally, though 2-point discrimination is not intact yet  EDEMA:   Eval:  Mildly swollen in right wrist today  COGNITION: Eval: Overall cognitive status: WFL for evaluation today, though he does admit to being a bit impulsive and states that he needs reminders to not push himself too hard or hurt himself.  He admits to trying to lift up objects with his hand and arm while he was still in his cast  OBSERVATIONS:   Eval: Surgical site clean and covered with Steri-Strips, sensation appears intact and healing, overt stiffness, weakness and decreased coordination for now.  Fingers very stiff  Right thumb DSRN repair   TODAY'S TREATMENT:  Post-evaluation treatment:    Custom orthotic fabrication was indicated due to pt's healing radial nerve repair and need for safe, functional positioning. OT fabricated custom forearm-based thumb spica orthosis for pt today to immobilize the wrist and CMC joint of the thumb. It fit well  with no areas of pressure, pt states a comfortable fit. Pt was educated on the wearing schedule (on at all times except for hygiene and exercises), to avoid exposing it to sources of heat, to wipe clean as needed (do not wash, use harsh detergents), to call or come in ASAP if it is causing any irritation or is not achieving desired function. It will be checked/adjusted in upcoming sessions, as needed. Pt states understanding all directions.    For safety/self-care, the patient was recommended to do no pushing/pulling or weightbearing through the right thumb/hand or arm now.  He states that he was trying to pick up objects of significant weight while he had his cast on, and he was told that this is inappropriate,  and potentially dangerous.  The patient was taught to care for the postsurgical wound by doing light touch desensitization, gentle scar mobilizations, keep moist with a Vaseline, keep covered  until completely healed.  The patient should not be soaking the wound, either.  The patient can quickly shower and dab dry.  The patient was given a compressive stockinette to help with swelling.  The patient was also given the following home exercise program to perform in a nonpainful fashion approximately 4 times a day.  Each one was reviewed with the patient, with performance back to show understanding and no significant pain.  He should not be moving his wrist yet per postop protocols.  The patient leaves without any questions or concerns.  Exercises - Standing Elbow Flexion Extension AROM  - 4-6 x daily - 10-15 reps - Turn [employer] Facing Up & Down  - 4-6 x daily - 10-15 reps - Finger Spreading  - 4-6 x daily - 10-15 reps - Tendon Glides  - 4-6 x daily - 3-5 reps - 2-3 seconds hold - Thumb Opposition  - 4-6 x daily - 10 reps   PATIENT EDUCATION: Education details: See tx section above for details  Person educated: Patient Education method: Verbal Instruction, Teach back, Handouts  Education  comprehension: States and demonstrates understanding, Additional Education required    HOME EXERCISE PROGRAM: Access Code: [email or web address] URL: [email or web address] Date: 03/17/2024 Prepared by: Melvenia Ada   GOALS: Goals reviewed with patient? Yes   SHORT TERM GOALS: (STG required if POC>30 days) Target Date: 04/03/24  Pt will obtain protective, custom orthotic. Goal status: 03/17/2024: Met  2.  Pt will demo/state understanding of initial HEP to improve pain levels and prerequisite motion. Goal status: INITIAL   LONG TERM GOALS: Target Date: 05/01/24  Pt will improve functional ability by decreased impairment per PSFS assessment from TBD to TBD or better, for better quality of life. Goal status: INITIAL-TBD baseline measures next session as time allows  2.  Pt will improve grip strength in right hand from unsafe to test to at least 40 lbs for functional use at home and in IADLs. Goal status: INITIAL  3.  Pt will improve A/ROM in right wrist flexion/extension from unsafe to test to at least 60 degrees each, to have functional motion for tasks like reach and grasp.  Goal status: INITIAL  4.  Pt will improve strength in right wrist flexion/extension from unsafe to test to at least 4+/5 MMT to have increased functional ability to carry out selfcare and higher-level homecare tasks with less difficulty. Goal status: INITIAL  5.  Pt will improve coordination skills in right hand and arm, as seen by within functional limit score on nine-hole peg testing to have increased functional ability to carry out fine motor tasks (fasteners, etc.) and more complex, coordinated IADLs (meal prep, sports, etc.).  Goal status: INITIAL  6.  Pt will decrease pain at worst from 7/10 to 3/10 or better to have better sleep and occupational participation in daily roles. Goal status: INITIAL   ASSESSMENT:  CLINICAL IMPRESSION: Patient is a 23 y.o. male who was seen today for  occupational therapy evaluation for stiffness, weakness, swelling, decreased functional ability and decreased safety awareness after laceration and dorsal sensory radial nerve repair 2 weeks ago.  The patient will benefit from outpatient occupational therapy to decrease symptoms, improve functional upper extremity use, and increase quality of life.  PERFORMANCE DEFICITS: in functional skills including ADLs, IADLs, coordination, sensation, edema, ROM, strength, pain, fascial restrictions, body mechanics, endurance, decreased knowledge of precautions, wound, and UE functional use, cognitive skills including problem solving and safety awareness, and psychosocial skills including coping strategies, environmental adaptation,  habits, and routines and behaviors.   IMPAIRMENTS: are limiting patient from ADLs, IADLs, rest and sleep, and leisure.   COMORBIDITIES: may have co-morbidities  that affects occupational performance. Patient will benefit from skilled OT to address above impairments and improve overall function.  MODIFICATION OR ASSISTANCE TO COMPLETE EVALUATION: No modification of tasks or assist necessary to complete an evaluation.  OT OCCUPATIONAL PROFILE AND HISTORY: Detailed assessment: Review of records and additional review of physical, cognitive, psychosocial history related to current functional performance.  CLINICAL DECISION MAKING: LOW - limited treatment options, no task modification necessary  REHAB POTENTIAL: Excellent  EVALUATION COMPLEXITY: Low      PLAN:  OT FREQUENCY: 1-2x/week  OT DURATION: 6 weeks through 05/01/24 and up to 10 total visits as needed   PLANNED INTERVENTIONS: 97535 self care/ADL training, 02889 therapeutic exercise, 97530 therapeutic activity, 97112 neuromuscular re-education, 97140 manual therapy, 97035 ultrasound, 97032 electrical stimulation (manual), 97760 Orthotic Initial, [id number] Orthotic/Prosthetic subsequent, compression bandaging, Dry needling,  energy conservation, coping strategies training, and patient/family education  RECOMMENDED OTHER SERVICES: none now    CONSULTED AND AGREED WITH PLAN OF CARE: Patient  PLAN FOR NEXT SESSION:   Review initial HEP and recommendations, check orthosis, etc.    Melvenia Ada, OTR/L, CHT  03/17/2024, 5:36 PM

## 2024-03-17 ENCOUNTER — Ambulatory Visit (INDEPENDENT_AMBULATORY_CARE_PROVIDER_SITE_OTHER): Admitting: Orthopedic Surgery

## 2024-03-17 ENCOUNTER — Encounter: Payer: Self-pay | Admitting: Rehabilitative and Restorative Service Providers"

## 2024-03-17 ENCOUNTER — Other Ambulatory Visit: Payer: Self-pay | Admitting: Orthopedic Surgery

## 2024-03-17 ENCOUNTER — Ambulatory Visit (INDEPENDENT_AMBULATORY_CARE_PROVIDER_SITE_OTHER): Admitting: Rehabilitative and Restorative Service Providers"

## 2024-03-17 DIAGNOSIS — S648X1D Injury of other nerves at wrist and hand level of right arm, subsequent encounter: Secondary | ICD-10-CM

## 2024-03-17 DIAGNOSIS — R600 Localized edema: Secondary | ICD-10-CM

## 2024-03-17 DIAGNOSIS — G8929 Other chronic pain: Secondary | ICD-10-CM

## 2024-03-17 DIAGNOSIS — M5440 Lumbago with sciatica, unspecified side: Secondary | ICD-10-CM

## 2024-03-17 DIAGNOSIS — M25531 Pain in right wrist: Secondary | ICD-10-CM

## 2024-03-17 DIAGNOSIS — M6281 Muscle weakness (generalized): Secondary | ICD-10-CM | POA: Diagnosis not present

## 2024-03-17 DIAGNOSIS — R202 Paresthesia of skin: Secondary | ICD-10-CM | POA: Diagnosis not present

## 2024-03-17 MED ORDER — OXYCODONE HCL 5 MG PO TABS: 5.0000 mg | ORAL_TABLET | Freq: Four times a day (QID) | ORAL | 0 refills | Status: AC | PRN

## 2024-03-17 NOTE — Progress Notes (Signed)
   Jose Ho - 23 y.o. male MRN [id number]  Date of birth: 2000-09-26  Office Visit Note: Visit Date: 03/17/2024 PCP: Marvine Rush, MD Referred by: Marvine Rush, MD  Subjective:  HPI: Jose Ho is a 23 y.o. male who presents today for follow up 2 weeks status post right wrist penetrating wound exploration and dorsal radial sensory nerve repair.  Cast removed today, numbness does persist in DRSN distribution.    Pertinent ROS were reviewed with the patient and found to be negative unless otherwise specified above in HPI.   Assessment & Plan: Visit Diagnoses:  1. 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 Chronic low back pain with sciatica, sciatica laterality unspecified, unspecified back pain laterality     Plan: Doing well overall.  Sutures removed today.  Continue with occupational therapy as prescribed.  Follow-up myself in approximately 4 weeks.  Work note was provided today for no use of the right hand, splint at all times.   Follow-up: No follow-ups on file.   Meds & Orders: No orders of the defined types were placed in this encounter.   Orders Placed This Encounter  Procedures   Ambulatory referral to Physical Medicine Rehab     Procedures: No procedures performed       Objective:   Vital Signs: There were no vitals taken for this visit.    Ortho Exam Right wrist with well healing incisional site, sutures removed today without erythema or drainage, sensory limited to light touch DRSN, mildly positive advancing tinels  Imaging: No results found.   Jose Ho, M.D. [facility], Hand Surgery

## 2024-03-24 NOTE — Therapy (Signed)
 OUTPATIENT OCCUPATIONAL THERAPY TREATMENT NOTE  Patient Name: Jose Ho MRN: [id number] DOB:[birth year], 23 y.o., male 41 Date: 03/25/2024  PCP: [facility] MD REFERRING PROVIDER:  Arlinda Buster, MD    END OF SESSION:  OT End of Session - 03/25/24 1527     Visit Number 2    Number of Visits 10    Date for Recertification  05/01/24    Authorization Type [employer]    OT Start Time 1527    OT Stop Time 1611    OT Time Calculation (min) 44 min    Equipment Utilized During Treatment orthotic materials    Activity Tolerance Patient tolerated treatment well;No increased pain;Patient limited by fatigue;Patient limited by pain    Behavior During Therapy [facility] for tasks assessed/performed           Past Medical History:  Diagnosis Date   Anxiety    Past Surgical History:  Procedure Laterality Date   MOUTH SURGERY     NERVE REPAIR Right 03/03/2024   Procedure: DORSAL RADIAL SENSORY NERVE REPAIR;  Surgeon: Arlinda Buster, MD;  Location: [facility];  Service: Orthopedics;  Laterality: Right;   WOUND EXPLORATION Right 03/03/2024   Procedure: RIGHT WRIST WOUND EXPLORATION;  Surgeon: Arlinda Buster, MD;  Location: [facility];  Service: Orthopedics;  Laterality: Right;   Patient Active Problem List   Diagnosis Date Noted   Laceration of skin and cutaneous sensory nerve of right upper extremity 03/03/2024    ONSET DATE: DOS 03/03/24  REFERRING DIAG: D35.68KJ (ICD-10-CM) - Laceration of digital nerve of right thumb, initial encounter   THERAPY DIAG:  Localized edema  Muscle weakness (generalized)  Pain in right wrist  Paresthesia of skin  Rationale for Evaluation and Treatment: Rehabilitation  PERTINENT HISTORY: None related to this condition The original injury took place approx 3 weeks ago.   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  He states that I want to do this correctly, because I  was rushed through a prior leg injury and now I have back pain chronically.  However, he does admit to trying to lift things while in his cast, which is inappropriate.  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 He states that he has been in a lot of pain recently.  He is not having much pain at rest right now, but he states higher pain in the night and still having occasional twinges of sharp pain.  He states that he has his girlfriend helping him move and passively manipulate his arm for him because it hurts so badly.  His girlfriend is with him today and she does say that he has been trying to use his hand more aggressively than was recommended at times (weightbearing and such).  OT reminds him that he should be doing no weightbearing right now    PAIN:  Are you having pain? Yes: NPRS scale: Pain fluctuates from 2-3/10 at rest to sudden jolts of pain up to  7/10 Pain location: Right wrist and surgical area Pain description: Sharp jolts of pain Aggravating factors: Unsure Relieving factors: Unsure   PATIENT GOALS: To safely improve the use of his right hand and  arm, regain sensory function and strength.  NEXT MD VISIT: 04/14/24   OBJECTIVE: (All objective assessments below are from initial evaluation on: 03/17/24 unless otherwise specified.)   HAND DOMINANCE: Right   ADLs: Overall ADLs: States decreased ability to grab, hold household objects, pain and difficulty to open containers, perform FMS tasks (manipulate fasteners on clothing), perform work tasks, mild to moderate bathing problems as well.    FUNCTIONAL OUTCOME MEASURES: Eval: Patient Specific Functional Scale: TBD in the next session as time allows (Higher Score  =  Better  Ability for the Selected Tasks)      UPPER EXTREMITY ROM     Shoulder to Wrist AROM Right eval Rt 03/25/24  Elbow flexion WFL   Elbow extension WFL   Forearm supination ~65   Forearm pronation  ~75   Wrist flexion NT 15  Wrist extension NT 44  Wrist ulnar deviation  31  Wrist radial deviation  15  Functional dart thrower's motion (F-DTM) in ulnar flexion    F-DTM in radial extension     (Blank rows = not tested)   Hand AROM Right eval Rt 03/25/24  Full Fist Ability (or Gap to Distal Palmar Crease) Unable due to overtly stiff and swollen fingers, and thumb Fingers touch palm for loose fist   Thumb Opposition  (Kapandji Scale)  4/10 7/10  Thumb MCP (0-60) TBD 0-80  Thumb IP (0-80)  0-37  Thumb Radial Abduction Span    Thumb Palmar Abduction Span    (Blank rows = not tested)   UPPER EXTREMITY MMT:    Eval:  NT at eval due to recent and still healing injuries. Will be tested when appropriate.   MMT Right TBD  Elbow flexion   Elbow extension   Forearm supination   Forearm pronation   Wrist flexion   Wrist extension   Wrist ulnar deviation   Wrist radial deviation   (Blank rows = not tested)  HAND FUNCTION: Eval: Observed weakness in affected Rt hand.  Details TBD when safe Grip strength Right: TBD lbs, Left: TBD lbs   COORDINATION: Eval: Observed coordination impairments with affected right hand.  Details TBD when safe Box and Blocks Test: Rt: TBD Blocks today (TBD is WFL)   SENSATION: Eval:  Light touch intact today, Semmes Weinstein monofilament test does test 2.83 all around the thumb volarly and dorsally, though 2-point discrimination is not intact yet  EDEMA:   Eval:  Mildly swollen in right wrist today  COGNITION: Eval: Overall cognitive status: WFL for evaluation today, though he does admit to being a bit impulsive and states that he needs reminders to not push himself too hard or hurt himself.  He admits to trying to lift up objects with his hand and  arm while he was still in his cast  OBSERVATIONS:   Eval: Surgical site clean and covered with Steri-Strips, sensation appears intact and healing, overt stiffness, weakness and decreased coordination for now.  Fingers very stiff  Right thumb DSRN repair   TODAY'S TREATMENT:  03/25/24: Now that he is 3 weeks postop, we can do a lot more techniques and exercises to help with his pain and stiffness.  He is educated on new wrist mid arc active range of motion for the wrist as bolded below, also thoroughly educated on desensitization techniques including vibration, brushing, moist heat, etc.  He tries each of these techniques for desensitization today and he does state some relief.  He also has some swelling around his thumb  where the orthosis may be pushing, so OT modifies it today and he states that it is feeling better.  He tolerates new mid arc motion well with some tenderness and nerve sensations, but overall does very well.  He does active range of motion for new baseline measures at the wrist as well.  We reviewed safety precautions including wearing orthosis except for hygiene and exercises, also no weightbearing pushing or pulling.   Exercises - Standing Elbow Flexion Extension AROM  - 4-6 x daily - 10-15 reps - Turn [employer] Facing Up & Down  - 4-6 x daily - 10-15 reps - Finger Spreading  - 4-6 x daily - 10-15 reps - Bend and Pull Back Wrist SLOWLY  - 4 x daily - 10-15 reps - Windshield Wipers   - 4 x daily - 10-15 reps - Tendon Glides  - 4-6 x daily - 3-5 reps - 2-3 seconds hold - Thumb Opposition  - 4-6 x daily - 10 reps   PATIENT EDUCATION: Education details: See tx section above for details  Person educated: Patient Education method: Verbal Instruction, Teach back, Handouts  Education comprehension: States and demonstrates understanding, Additional Education required    HOME EXERCISE PROGRAM: Access Code: [email or web address] URL: [email or web address] Date:  03/17/2024 Prepared by: Melvenia Ada   GOALS: Goals reviewed with patient? Yes   SHORT TERM GOALS: (STG required if POC>30 days) Target Date: 04/03/24  Pt will obtain protective, custom orthotic. Goal status: 03/17/2024: Met  2.  Pt will demo/state understanding of initial HEP to improve pain levels and prerequisite motion. Goal status: INITIAL   LONG TERM GOALS: Target Date: 05/01/24  Pt will improve functional ability by decreased impairment per PSFS assessment from TBD to TBD or better, for better quality of life. Goal status: INITIAL-TBD baseline measures next session as time allows  2.  Pt will improve grip strength in right hand from unsafe to test to at least 40 lbs for functional use at home and in IADLs. Goal status: INITIAL  3.  Pt will improve A/ROM in right wrist flexion/extension from unsafe to test to at least 60 degrees each, to have functional motion for tasks like reach and grasp.  Goal status: INITIAL  4.  Pt will improve strength in right wrist flexion/extension from unsafe to test to at least 4+/5 MMT to have increased functional ability to carry out selfcare and higher-level homecare tasks with less difficulty. Goal status: INITIAL  5.  Pt will improve coordination skills in right hand and arm, as seen by within functional limit score on nine-hole peg testing to have increased functional ability to carry out fine motor tasks (fasteners, etc.) and more complex, coordinated IADLs (meal prep, sports, etc.).  Goal status: INITIAL  6.  Pt will decrease pain at worst from 7/10 to 3/10 or better to have better sleep and occupational participation in daily roles. Goal status: INITIAL   ASSESSMENT:  CLINICAL IMPRESSION: 03/25/24: He is making improvements though he is having some nerve pain which is to be expected after a nerve injury and a nerve repair.  His girlfriend states that he has been trying to use his hand more than he is allowed to, though he  states he has been careful.  Overall he does seem to be progressing as typical  Eval: Patient is a 23 y.o. male who was seen today for occupational therapy evaluation for stiffness, weakness, swelling, decreased functional ability and decreased safety awareness after laceration and dorsal sensory radial nerve repair 2  weeks ago.  The patient will benefit from outpatient occupational therapy to decrease symptoms, improve functional upper extremity use, and increase quality of life.    PLAN:  OT FREQUENCY: 1-2x/week  OT DURATION: 6 weeks through 05/01/24 and up to 10 total visits as needed   PLANNED INTERVENTIONS: 97535 self care/ADL training, 02889 therapeutic exercise, 97530 therapeutic activity, 97112 neuromuscular re-education, 97140 manual therapy, 97035 ultrasound, 97032 electrical stimulation (manual), 97760 Orthotic Initial, 97763 Orthotic/Prosthetic subsequent, compression bandaging, Dry needling, energy conservation, coping strategies training, and patient/family education  RECOMMENDED OTHER SERVICES: none now    CONSULTED AND AGREED WITH PLAN OF CARE: Patient  PLAN FOR NEXT SESSION:   Check new active range of motion at the wrist and upgrade to an full arc range of motion, also take coordination measures and discuss weaning for light functional tasks.   Melvenia Ada, OTR/L, CHT  03/25/2024, 5:11 PM

## 2024-03-25 ENCOUNTER — Encounter: Payer: Self-pay | Admitting: Rehabilitative and Restorative Service Providers"

## 2024-03-25 ENCOUNTER — Telehealth: Payer: Self-pay | Admitting: Orthopedic Surgery

## 2024-03-25 ENCOUNTER — Ambulatory Visit (INDEPENDENT_AMBULATORY_CARE_PROVIDER_SITE_OTHER): Admitting: Rehabilitative and Restorative Service Providers"

## 2024-03-25 DIAGNOSIS — R202 Paresthesia of skin: Secondary | ICD-10-CM | POA: Diagnosis not present

## 2024-03-25 DIAGNOSIS — R600 Localized edema: Secondary | ICD-10-CM

## 2024-03-25 DIAGNOSIS — M25531 Pain in right wrist: Secondary | ICD-10-CM | POA: Diagnosis not present

## 2024-03-25 DIAGNOSIS — M6281 Muscle weakness (generalized): Secondary | ICD-10-CM | POA: Diagnosis not present

## 2024-03-25 NOTE — Telephone Encounter (Signed)
 I re-faxed FMLA [phone] and [employer] [phone] per patients request.

## 2024-03-27 ENCOUNTER — Ambulatory Visit: Admitting: Physical Medicine and Rehabilitation

## 2024-03-31 NOTE — Therapy (Signed)
 OUTPATIENT OCCUPATIONAL THERAPY TREATMENT NOTE  Patient Name: Jose Ho MRN: [id number] DOB:September 17, 2000, 23 y.o., male Today's Date: 04/01/2024  PCP: [facility] MD REFERRING PROVIDER:  Arlinda Buster, MD    END OF SESSION:  OT End of Session - 04/01/24 1348     Visit Number 3    Number of Visits 10    Date for Recertification  05/01/24    Authorization Type [employer]    OT Start Time 1348    OT Stop Time 1441    OT Time Calculation (min) 53 min    Activity Tolerance Patient tolerated treatment well;No increased pain;Patient limited by fatigue;Patient limited by pain    Behavior During Therapy [facility] for tasks assessed/performed            Past Medical History:  Diagnosis Date   Anxiety    Past Surgical History:  Procedure Laterality Date   MOUTH SURGERY     NERVE REPAIR Right 03/03/2024   Procedure: DORSAL RADIAL SENSORY NERVE REPAIR;  Surgeon: Arlinda Buster, MD;  Location: [facility];  Service: Orthopedics;  Laterality: Right;   WOUND EXPLORATION Right 03/03/2024   Procedure: RIGHT WRIST WOUND EXPLORATION;  Surgeon: Arlinda Buster, MD;  Location: [facility];  Service: Orthopedics;  Laterality: Right;   Patient Active Problem List   Diagnosis Date Noted   Laceration of skin and cutaneous sensory nerve of right upper extremity 03/03/2024    ONSET DATE: DOS 03/03/24  REFERRING DIAG: D35.68KJ (ICD-10-CM) - Laceration of digital nerve of right thumb, initial encounter   THERAPY DIAG:  Localized edema  Muscle weakness (generalized)  Pain in right wrist  Paresthesia of skin  Rationale for Evaluation and Treatment: Rehabilitation  PERTINENT HISTORY: None related to this condition The original injury took place approx 3 weeks ago.   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  He states that I want to do this correctly, because I was rushed through a prior leg injury and now I have  back pain chronically.  However, he does admit to trying to lift things while in his cast, which is inappropriate.  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 He states he was recently having a lot of swelling and pain, though he is not very painful or swollen right now.  He states that he has been restricting his weight lifting.  He also states having an upcoming appointment to check out his back with Dr. Eldonna and Du[place]aine this week.  Lastly, he asks the therapist for information for inpatient drug rehab facilities that he can refer his mother to, as he states that she recently had a stroke from drug use, and she has been suicidal in the past.  OT provides him with the contact number for [facility] and the 988 number for emergencies.  He can also call the local police nonemergency line to get help if these numbers do not yield any benefit to him.    PAIN:  Are you having pain? Yes: NPRS scale: Pain fluctuates from  1-2/10 at rest now Pain location: Right wrist and surgical area Pain description: Sharp jolts of pain Aggravating factors: Unsure Relieving factors: Unsure  PATIENT GOALS: To safely improve the use of his right hand and arm, regain sensory function and strength.  NEXT MD VISIT: 04/14/24   OBJECTIVE: (All objective assessments below are from initial evaluation on: 03/17/24 unless otherwise specified.)   HAND DOMINANCE: Right   ADLs: Overall ADLs: States decreased ability to grab, hold household objects, pain and difficulty to open containers, perform FMS tasks (manipulate fasteners on clothing), perform work tasks, mild to moderate bathing problems as well.    FUNCTIONAL OUTCOME MEASURES: Eval: Patient Specific  Functional Scale: TBD in the next session as time allows (Higher Score  =  Better Ability for the Selected Tasks)      UPPER EXTREMITY ROM     Shoulder to Wrist AROM Right eval Rt 03/25/24 Rt 04/01/24  Elbow flexion WFL    Elbow extension WFL    Forearm supination ~65  88  Forearm pronation  ~75  81  Wrist flexion NT 15 71  Wrist extension NT 44 56  Wrist ulnar deviation  31 34  Wrist radial deviation  15 18  Functional dart thrower's motion (F-DTM) in ulnar flexion     F-DTM in radial extension      (Blank rows = not tested)   Hand AROM Right eval Rt 03/25/24 Rt 04/01/24  Full Fist Ability (or Gap to Distal Palmar Crease) Unable due to overtly stiff and swollen fingers, and thumb Fingers touch palm for loose fist    Thumb Opposition  (Kapandji Scale)  4/10 7/10 10/10  Thumb MCP (0-60) TBD 0-80 0 - 101  Thumb IP (0-80)  0-37 0 - 35  Thumb Radial Abduction Span     Thumb Palmar Abduction Span     (Blank rows = not tested)   UPPER EXTREMITY MMT:    Eval:  NT at eval due to recent and still healing injuries. Will be tested when appropriate.   MMT Right TBD  Elbow flexion   Elbow extension   Forearm supination   Forearm pronation   Wrist flexion   Wrist extension   Wrist ulnar deviation   Wrist radial deviation   (Blank rows = not tested)  HAND FUNCTION: Eval: Observed weakness in affected Rt hand.  Details TBD when safe Grip strength Right: TBD lbs, Left: TBD lbs   COORDINATION: 04/06/24:  Box and Blocks Test: Rt: TBD Blocks today (TBD is WFL)   SENSATION: Eval:  Light touch intact today, Semmes Weinstein monofilament test does test 2.83 all around the thumb volarly and dorsally, though 2-point discrimination is not intact yet  EDEMA:   Eval:  Mildly swollen in right wrist today  COGNITION: Eval: Overall cognitive status: WFL for evaluation today, though he does admit to being a bit impulsive and states that he needs reminders to not push himself too hard  or hurt himself.  He admits to trying to lift up objects with his hand and arm while he was still in his cast  OBSERVATIONS:   Eval: Surgical site clean and covered with Steri-Strips, sensation appears intact and healing, overt stiffness, weakness and decreased coordination for now.  Fingers very stiff  Right thumb DSRN repair   TODAY'S TREATMENT:  04/01/24: He starts with active range of motion for exercise as well as new measures which show significant improvement since last seen.  Though he describes swelling and pain, he does not have any significant swelling now and is actually only 1 mm different in the right and left thumbs and right and left wrists.  Today we discussed safety/self-care and not lifting more than 5 to 10 pounds, but he can begin to wean from his orthosis.  He no longer needs to wear it in the day or night as tolerated, but he can wear it in the day or night for comfort over the next week as necessary.  He can also wear to prevent overuse if necessary.   OT discusses upgrading his home exercise program to the list below including new stretches for the wrist and thumb and forearm as well as in hand manipulation skills for fine motor skill coordination.  He fumbles with fine motor skill somewhat, but he finds none of these things painful.  He has difficulty finding healthy stretches, but he is not having any specific pain.  He states understanding all directions and leaves without pain.     Exercises - Forearm Supination Stretch  - 3-4 x daily - 3-5 reps - 15 sec hold - Forearm Pronation Stretch  - 3-4 x daily - 3-5 reps - 15 sec hold - Wrist Flexion Stretch  - 4 x daily - 3-5 reps - 15 sec hold - Wrist Prayer Stretch  - 4 x daily - 3-5 reps - 15 sec hold - Thumb stretch  - 4 x daily - 3-5 reps - 15-20 sec hold  In-Hand Manipulation Skills Rotation:  Hold pen, try to "twirl" like a baton, keeping parallel (or flat) with surface of table. Try going BOTH directions 10x   Flip:  Hold pen in writing position,  flip in an arch to "erase" position, then back to "write" position. Do not lift hand off table.  10x  Translation:  Open hand palm up,  put an object in your palm and then use your fingers and thumb to move it to the tips of your fingers, pinched against your thumb. (bigger is easier (fat marker), smaller is harder (penny)) 10x  Shift:  Hold pen like a dart, start "shifting" it forward & backwards from tip to base (like putting a key in a key hole) 10x      PATIENT EDUCATION: Education details: See tx section above for details  Person educated: Patient Education method: Verbal Instruction, Teach back, Handouts  Education comprehension: States and demonstrates understanding, Additional Education required    HOME EXERCISE PROGRAM: Access Code: [email or web address] URL: [email or web address] Date: 03/17/2024 Prepared by: Melvenia Ada   GOALS: Goals reviewed with patient? Yes   SHORT TERM GOALS: (STG required if POC>30 days) Target Date: 04/03/24  Pt will obtain protective, custom orthotic. Goal status: 03/17/2024: Met  2.  Pt will demo/state understanding of initial HEP to improve pain levels and prerequisite motion. Goal status: INITIAL   LONG TERM GOALS: Target Date: 05/01/24  Pt will improve functional ability by decreased impairment per PSFS assessment from TBD to TBD or better, for better quality of life. Goal status: INITIAL-TBD baseline measures next session as time allows  2.  Pt will improve grip strength in right hand from unsafe to test to at least 40 lbs for functional use at home and in IADLs. Goal status: INITIAL  3.  Pt will improve A/ROM in right wrist flexion/extension from unsafe to test to at least 60 degrees each, to have functional motion for tasks like reach and grasp.  Goal status: INITIAL  4.  Pt will improve strength in right wrist flexion/extension from unsafe to test to at least 4+/5 MMT to have  increased functional ability to carry out selfcare and higher-level homecare tasks with  less difficulty. Goal status: INITIAL  5.  Pt will improve coordination skills in right hand and arm, as seen by within functional limit score on nine-hole peg testing to have increased functional ability to carry out fine motor tasks (fasteners, etc.) and more complex, coordinated IADLs (meal prep, sports, etc.).  Goal status: INITIAL  6.  Pt will decrease pain at worst from 7/10 to 3/10 or better to have better sleep and occupational participation in daily roles. Goal status: INITIAL   ASSESSMENT:  CLINICAL IMPRESSION: 04/01/24: Doing well with stretches, but still having some anxiousness, some numbness, and he was told to wean out of his orthosis slowly but as soon as able.   03/25/24: He is making improvements though he is having some nerve pain which is to be expected after a nerve injury and a nerve repair.  His girlfriend states that he has been trying to use his hand more than he is allowed to, though he states he has been careful.  Overall he does seem to be progressing as typical  Eval: Patient is a 23 y.o. male who was seen today for occupational therapy evaluation for stiffness, weakness, swelling, decreased functional ability and decreased safety awareness after laceration and dorsal sensory radial nerve repair 2 weeks ago.  The patient will benefit from outpatient occupational therapy to decrease symptoms, improve functional upper extremity use, and increase quality of life.    PLAN:  OT FREQUENCY: 1-2x/week  OT DURATION: 6 weeks through 05/01/24 and up to 10 total visits as needed   PLANNED INTERVENTIONS: 97535 self care/ADL training, 02889 therapeutic exercise, 97530 therapeutic activity, 97112 neuromuscular re-education, 97140 manual therapy, 97035 ultrasound, 97032 electrical stimulation (manual), 97760 Orthotic Initial, [id number] Orthotic/Prosthetic subsequent, compression bandaging, Dry  needling, energy conservation, coping strategies training, and patient/family education  CONSULTED AND AGREED WITH PLAN OF CARE: Patient  PLAN FOR NEXT SESSION:   Check weaning, check motion, upgrade to light strengthening as tolerated.   Melvenia Ada, OTR/L, CHT  04/01/2024, 3:33 PM

## 2024-04-01 ENCOUNTER — Encounter: Payer: Self-pay | Admitting: Rehabilitative and Restorative Service Providers"

## 2024-04-01 ENCOUNTER — Ambulatory Visit (INDEPENDENT_AMBULATORY_CARE_PROVIDER_SITE_OTHER): Admitting: Rehabilitative and Restorative Service Providers"

## 2024-04-01 DIAGNOSIS — R600 Localized edema: Secondary | ICD-10-CM

## 2024-04-01 DIAGNOSIS — R202 Paresthesia of skin: Secondary | ICD-10-CM | POA: Diagnosis not present

## 2024-04-01 DIAGNOSIS — M6281 Muscle weakness (generalized): Secondary | ICD-10-CM

## 2024-04-01 DIAGNOSIS — M25531 Pain in right wrist: Secondary | ICD-10-CM | POA: Diagnosis not present

## 2024-04-02 NOTE — Therapy (Incomplete)
 OUTPATIENT OCCUPATIONAL THERAPY TREATMENT NOTE  Patient Name: Jose Ho MRN: [id number] DOB:11-05-2000, 23 y.o., male Today's Date: 04/02/2024  PCP: [facility] MD REFERRING PROVIDER:  Arlinda Buster, MD    END OF SESSION:      Past Medical History:  Diagnosis Date   Anxiety    Past Surgical History:  Procedure Laterality Date   MOUTH SURGERY     NERVE REPAIR Right 03/03/2024   Procedure: DORSAL RADIAL SENSORY NERVE REPAIR;  Surgeon: Arlinda Buster, MD;  Location: [facility];  Service: Orthopedics;  Laterality: Right;   WOUND EXPLORATION Right 03/03/2024   Procedure: RIGHT WRIST WOUND EXPLORATION;  Surgeon: Arlinda Buster, MD;  Location: [facility];  Service: Orthopedics;  Laterality: Right;   Patient Active Problem List   Diagnosis Date Noted   Laceration of skin and cutaneous sensory nerve of right upper extremity 03/03/2024    ONSET DATE: DOS 03/03/24  REFERRING DIAG: D35.68KJ (ICD-10-CM) - Laceration of digital nerve of right thumb, initial encounter   THERAPY DIAG:  No diagnosis found.  Rationale for Evaluation and Treatment: Rehabilitation  PERTINENT HISTORY: None related to this condition The original injury took place approx 3 weeks ago.   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  He states that I want to do this correctly, because I was rushed through a prior leg injury and now I have back pain chronically.  However, he does admit to trying to lift things while in his cast, which is inappropriate.  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 He states ***    PAIN:  Are you having pain? Yes: NPRS scale: Pain fluctuates  from  *** 1-2/10 at rest now Pain location: Right wrist and surgical area Pain description: Sharp jolts of pain Aggravating factors: Unsure Relieving factors: Unsure   PATIENT GOALS: To safely improve the use of his right hand and arm, regain sensory function and strength.  NEXT MD VISIT: 04/14/24   OBJECTIVE: (All objective assessments below are from initial evaluation on: 03/17/24 unless otherwise specified.)   HAND DOMINANCE: Right   ADLs: Overall ADLs: States decreased ability to grab, hold household objects, pain and difficulty to open containers, perform FMS tasks (manipulate fasteners on clothing), perform work tasks, mild to moderate bathing problems as well.    FUNCTIONAL OUTCOME MEASURES: Eval: Quick DASH: *** % today (higher % = more impairment)    UPPER EXTREMITY ROM     Shoulder to Wrist AROM Right eval Rt 03/25/24 Rt 04/01/24  Elbow flexion WFL    Elbow extension WFL    Forearm supination ~65  88  Forearm pronation  ~75  81  Wrist flexion NT 15 71  Wrist extension NT 44 56  Wrist ulnar deviation  31 34  Wrist radial deviation  15 18  Functional dart thrower's motion (F-DTM) in ulnar flexion     F-DTM in radial extension      (Blank rows = not tested)   Hand AROM Right eval Rt 03/25/24 Rt 04/01/24  Full Fist Ability (or Gap to Distal Palmar Crease) Unable due to overtly stiff and swollen fingers, and thumb Fingers touch palm for loose fist  Thumb Opposition  (Kapandji Scale)  4/10 7/10 10/10  Thumb MCP (0-60) TBD 0-80 0 - 101  Thumb IP (0-80)  0-37 0 - 35  Thumb Radial Abduction Span     Thumb Palmar Abduction Span     (Blank rows = not tested)   UPPER EXTREMITY MMT:    Eval:  NT at eval due to recent and still healing injuries. Will be tested when appropriate.   MMT Right TBD  Elbow flexion   Elbow extension   Forearm supination   Forearm pronation   Wrist flexion   Wrist extension   Wrist ulnar deviation   Wrist radial deviation    (Blank rows = not tested)  HAND FUNCTION: 04/03/24: Grip strength Right: *** lbs, Left: *** lbs   COORDINATION: 04/06/24:  Box and Blocks Test: Rt: ***  Blocks today (TBD is WFL)   SENSATION: 04/06/24: ***    Eval:  Light touch intact today, Semmes Weinstein monofilament test does test 2.83 all around the thumb volarly and dorsally, though 2-point discrimination is not intact yet  EDEMA:   Eval:  Mildly swollen in right wrist today  COGNITION: Eval: Overall cognitive status: WFL for evaluation today, though he does admit to being a bit impulsive and states that he needs reminders to not push himself too hard or hurt himself.  He admits to trying to lift up objects with his hand and arm while he was still in his cast  OBSERVATIONS:   Eval: Surgical site clean and covered with Steri-Strips, sensation appears intact and healing, overt stiffness, weakness and decreased coordination for now.  Fingers very stiff  Right thumb DSRN repair   TODAY'S TREATMENT:  04/06/24: *** Check weaning, check motion, upgrade to light strengthening as tolerated.     Exercises - Forearm Supination Stretch  - 3-4 x daily - 3-5 reps - 15 sec hold - Forearm Pronation Stretch  - 3-4 x daily - 3-5 reps - 15 sec hold - Wrist Flexion Stretch  - 4 x daily - 3-5 reps - 15 sec hold - Wrist Prayer Stretch  - 4 x daily - 3-5 reps - 15 sec hold - Thumb stretch  - 4 x daily - 3-5 reps - 15-20 sec hold  In-Hand Manipulation Skills Rotation:  Hold pen, try to "twirl" like a baton, keeping parallel (or flat) with surface of table. Try going BOTH directions 10x  Flip:  Hold pen in writing position,  flip in an arch to "erase" position, then back to "write" position. Do not lift hand off table.  10x  Translation:  Open hand palm up,  put an object in your palm and then use your fingers and thumb to move it to the tips of your fingers, pinched against your thumb. (bigger is easier (fat marker), smaller is harder  (penny)) 10x  Shift:  Hold pen like a dart, start "shifting" it forward & backwards from tip to base (like putting a key in a key hole) 10x      PATIENT EDUCATION: Education details: See tx section above for details  Person educated: Patient Education method: Verbal Instruction, Teach back, Handouts  Education comprehension: States and demonstrates understanding, Additional Education required    HOME EXERCISE PROGRAM: Access Code: [email or web address] URL: [email or web address] Date: 03/17/2024 Prepared by: Melvenia Ada   GOALS: Goals reviewed with patient? Yes   SHORT TERM GOALS: (STG required if POC>30 days) Target Date: 04/03/24  Pt will obtain protective, custom orthotic. Goal status: 03/17/2024: Met  2.  Pt will demo/state understanding of initial HEP to improve pain levels and prerequisite motion. Goal status: INITIAL   LONG TERM GOALS: Target Date: 05/01/24  Pt will improve functional ability by decreased impairment per PSFS assessment from TBD to TBD or better, for better quality of life. Goal status: INITIAL-TBD baseline measures next session as time allows  2.  Pt will improve grip strength in right hand from unsafe to test to at least 40 lbs for functional use at home and in IADLs. Goal status: INITIAL  3.  Pt will improve A/ROM in right wrist flexion/extension from unsafe to test to at least 60 degrees each, to have functional motion for tasks like reach and grasp.  Goal status: INITIAL  4.  Pt will improve strength in right wrist flexion/extension from unsafe to test to at least 4+/5 MMT to have increased functional ability to carry out selfcare and higher-level homecare tasks with less difficulty. Goal status: INITIAL  5.  Pt will improve coordination skills in right hand and arm, as seen by within functional limit score on nine-hole peg testing to have increased functional ability to carry out fine motor tasks (fasteners, etc.) and more complex,  coordinated IADLs (meal prep, sports, etc.).  Goal status: INITIAL  6.  Pt will decrease pain at worst from 7/10 to 3/10 or better to have better sleep and occupational participation in daily roles. Goal status: INITIAL   ASSESSMENT:  CLINICAL IMPRESSION: 04/06/24: ***   04/01/24: Doing well with stretches, but still having some anxiousness, some numbness, and he was told to wean out of his orthosis slowly but as soon as able.   03/25/24: He is making improvements though he is having some nerve pain which is to be expected after a nerve injury and a nerve repair.  His girlfriend states that he has been trying to use his hand more than he is allowed to, though he states he has been careful.  Overall he does seem to be progressing as typical  Eval: Patient is a 23 y.o. male who was seen today for occupational therapy evaluation for stiffness, weakness, swelling, decreased functional ability and decreased safety awareness after laceration and dorsal sensory radial nerve repair 2 weeks ago.  The patient will benefit from outpatient occupational therapy to decrease symptoms, improve functional upper extremity use, and increase quality of life.    PLAN:  OT FREQUENCY: 1-2x/week  OT DURATION: 6 weeks through 05/01/24 and up to 10 total visits as needed   PLANNED INTERVENTIONS: 97535 self care/ADL training, 02889 therapeutic exercise, 97530 therapeutic activity, 97112 neuromuscular re-education, 97140 manual therapy, 97035 ultrasound, 97032 electrical stimulation (manual), 97760 Orthotic Initial, [id number] Orthotic/Prosthetic subsequent, compression bandaging, Dry needling, energy conservation, coping strategies training, and patient/family education  CONSULTED AND AGREED WITH PLAN OF CARE: Patient  PLAN FOR NEXT SESSION:   ***  Melvenia Ada, OTR/L, CHT  04/02/2024, 9:32 AM

## 2024-04-03 ENCOUNTER — Other Ambulatory Visit: Payer: Self-pay

## 2024-04-03 ENCOUNTER — Encounter: Payer: Self-pay | Admitting: Physical Medicine and Rehabilitation

## 2024-04-03 ENCOUNTER — Ambulatory Visit: Admitting: Physical Medicine and Rehabilitation

## 2024-04-03 DIAGNOSIS — M545 Low back pain, unspecified: Secondary | ICD-10-CM

## 2024-04-03 DIAGNOSIS — M79605 Pain in left leg: Secondary | ICD-10-CM | POA: Diagnosis not present

## 2024-04-03 DIAGNOSIS — M7918 Myalgia, other site: Secondary | ICD-10-CM | POA: Diagnosis not present

## 2024-04-03 DIAGNOSIS — G8929 Other chronic pain: Secondary | ICD-10-CM

## 2024-04-03 NOTE — Progress Notes (Signed)
 Jose Ho - 23 y.o. male MRN [id number]  Date of birth: [birth year]  Office Visit Note: Visit Date: 04/03/2024 PCP: Marvine Rush, MD Referred by: Marvine Rush, MD  Subjective: Chief Complaint  Patient presents with   Spine - Pain   HPI: Jose Ho is a 23 y.o. male who comes in today per the request of Dr. Anshul Agarwala for evaluation of chronic, worsening and severe left sided lower back pain radiating up his back. Also reports chronic left leg pain due to forklift injury 5 years ago. His lower back issues also started at time of the accident. His pain worsens with prolonged standing. He describes pain as sore and aching sensation, currently rates as 5 out of 10. Some relief of pain with home exercise regimen, rest and use of medications. No history of formal physical therapy for lower back pain. No prior lumbar radiographs. Patient currently working as games developer. Patient denies focal weakness, numbness and tingling. No recent trauma or falls.  He underwent right hand wound exploration with Dr. Arlinda on 03/03/2024.     Review of Systems  Musculoskeletal:  Positive for back pain and myalgias.  Neurological:  Negative for tingling, sensory change, focal weakness and weakness.  All other systems reviewed and are negative.  Otherwise per HPI.  Assessment & Plan: Visit Diagnoses:    ICD-10-CM   1. Chronic bilateral low back pain without sciatica  M54.50 XR Lumbar Spine 2-3 Views   G89.29 MR LUMBAR SPINE WO CONTRAST    Ambulatory referral to Physical Therapy    2. Myofascial pain syndrome  M79.18 MR LUMBAR SPINE WO CONTRAST    Ambulatory referral to Physical Therapy    3. Pain in left leg  M79.605        Plan: Findings:  Chronic, worsening and severe left sided lower back pain radiating up back. Patient continues to have severe pain despite good conservative therapies such as home exercise regimen, rest and use of medications. I obtained lumbar radiographs  in the office today that show slight increase in lordosis, normal segmentation. Well preserved disc spacing, no spondylolisthesis. No pars defects. Overall, normal looking lumbar x-rays for his age. We discussed treatment plan in detail today. I do think he would benefit from short course of dedicated physical therapy for his back. I would like him to establish consistent home exercise regimen. Given the chronicity of his pain I also placed order for lumbar MRI imaging to rule out any concerning structural abnormalities. We will see him back for lumbar MRI review. No red flag symptoms noted upon exam today.     Meds & Orders: No orders of the defined types were placed in this encounter.   Orders Placed This Encounter  Procedures   XR Lumbar Spine 2-3 Views   MR LUMBAR SPINE WO CONTRAST   Ambulatory referral to Physical Therapy    Follow-up: Return for Lumbar MRI review.   Procedures: No procedures performed      Clinical History: No specialty comments available.   He reports that he has quit smoking. His smoking use included e-cigarettes and cigarettes. He uses smokeless tobacco. No results for input(s): HGBA1C, LABURIC in the last 8760 hours.  Objective:  VS:  HT:    WT:   BMI:     BP:   HR: bpm  TEMP: ( )  RESP:  Physical Exam Vitals and nursing note reviewed.  HENT:     Head: Normocephalic and atraumatic.  Right Ear: External ear normal.     Left Ear: External ear normal.     Nose: Nose normal.     Mouth/Throat:     Mouth: Mucous membranes are moist.  Eyes:     Extraocular Movements: Extraocular movements intact.  Cardiovascular:     Rate and Rhythm: Normal rate.     Pulses: Normal pulses.  Pulmonary:     Effort: Pulmonary effort is normal.  Abdominal:     General: Abdomen is flat. There is no distension.  Musculoskeletal:        General: Tenderness present.     Cervical back: Normal range of motion.     Comments: Patient rises from seated position to  standing without difficulty. Good lumbar range of motion. No pain noted with facet loading. 5/5 strength noted with bilateral hip flexion, knee flexion/extension, ankle dorsiflexion/plantarflexion and EHL. No clonus noted bilaterally. No pain upon palpation of greater trochanters. No pain with internal/external rotation of bilateral hips. Sensation intact bilaterally. Myofascial tenderness noted to left lumbar and thoracic paraspinal regions upon palpation today. Negative slump test bilaterally. Ambulates without aid, gait steady.     Skin:    General: Skin is warm and dry.     Capillary Refill: Capillary refill takes less than 2 seconds.  Neurological:     General: No focal deficit present.     Mental Status: He is alert and oriented to person, place, and time.  Psychiatric:        Mood and Affect: Mood normal.        Behavior: Behavior normal.     Ortho Exam  Imaging: No results found.  Past Medical/Family/Surgical/Social History: Medications & Allergies reviewed per EMR, new medications updated. Patient Active Problem List   Diagnosis Date Noted   Laceration of skin and cutaneous sensory nerve of right upper extremity 03/03/2024   Past Medical History:  Diagnosis Date   Anxiety    History reviewed. No pertinent family history. Past Surgical History:  Procedure Laterality Date   MOUTH SURGERY     NERVE REPAIR Right 03/03/2024   Procedure: DORSAL RADIAL SENSORY NERVE REPAIR;  Surgeon: Arlinda Buster, MD;  Location: [facility];  Service: Orthopedics;  Laterality: Right;   WOUND EXPLORATION Right 03/03/2024   Procedure: RIGHT WRIST WOUND EXPLORATION;  Surgeon: Arlinda Buster, MD;  Location: [facility];  Service: Orthopedics;  Laterality: Right;   Social History   Occupational History   Not on file  Tobacco Use   Smoking status: Former    Current packs/day: 0.50    Types: E-cigarettes, Cigarettes   Smokeless tobacco: Current  Vaping Use    Vaping status: Never Used  Substance and Sexual Activity   Alcohol use: No   Drug use: No   Sexual activity: Not on file

## 2024-04-03 NOTE — Progress Notes (Signed)
 Pain Scale   Average Pain 5 Patient advising he has spine which is chronic  and has been present x 5 years        +Driver, -BT, -Dye Allergies.

## 2024-04-06 ENCOUNTER — Encounter: Payer: Self-pay | Admitting: Radiology

## 2024-04-06 ENCOUNTER — Encounter: Admitting: Rehabilitative and Restorative Service Providers"

## 2024-04-07 ENCOUNTER — Encounter: Payer: Self-pay | Admitting: Physical Medicine and Rehabilitation

## 2024-04-07 NOTE — Therapy (Signed)
 OUTPATIENT OCCUPATIONAL THERAPY TREATMENT NOTE  Patient Name: Jose Ho MRN: [id number] DOB:2000-06-23, 23 y.o., male 33 Date: 04/08/2024  PCP: [facility] MD REFERRING PROVIDER:  Arlinda Buster, MD    END OF SESSION:  OT End of Session - 04/08/24 0802     Visit Number 4    Number of Visits 10    Date for Recertification  05/01/24    Authorization Type [employer]    OT Start Time 0803    OT Stop Time 0853    OT Time Calculation (min) 50 min    Activity Tolerance Patient tolerated treatment well;No increased pain;Patient limited by fatigue;Patient limited by pain    Behavior During Therapy [facility] for tasks assessed/performed             Past Medical History:  Diagnosis Date   Anxiety    Past Surgical History:  Procedure Laterality Date   MOUTH SURGERY     NERVE REPAIR Right 03/03/2024   Procedure: DORSAL RADIAL SENSORY NERVE REPAIR;  Surgeon: Arlinda Buster, MD;  Location: [facility];  Service: Orthopedics;  Laterality: Right;   WOUND EXPLORATION Right 03/03/2024   Procedure: RIGHT WRIST WOUND EXPLORATION;  Surgeon: Arlinda Buster, MD;  Location: [facility];  Service: Orthopedics;  Laterality: Right;   Patient Active Problem List   Diagnosis Date Noted   Laceration of skin and cutaneous sensory nerve of right upper extremity 03/03/2024    ONSET DATE: DOS 03/03/24  REFERRING DIAG: D35.68KJ (ICD-10-CM) - Laceration of digital nerve of right thumb, initial encounter   THERAPY DIAG:  Localized edema  Muscle weakness (generalized)  Pain in right wrist  Paresthesia of skin  Rationale for Evaluation and Treatment: Rehabilitation  PERTINENT HISTORY: None related to this condition The original injury took place approx 3 weeks ago.   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  He states that I want to do this correctly, because I was rushed through a prior leg injury and now I have  back pain chronically.  However, he does admit to trying to lift things while in his cast, which is inappropriate.  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 He states having some burning nerve pain around the surgical site as well as numbness still.  OT reminds him that this is normal until 3 to 5 months postop.  He admits to lifting and pushing and pulling heavy things forgetting at times.  OT tells him to wear a glove or something on his hand to remind him to not lift heavy objects yet.    PAIN:  Are you having pain? Yes: NPRS scale: Pain fluctuates from  5/10 at rest now Pain location: Right wrist and surgical area Pain description: Sharp jolts of pain Aggravating factors: Unsure Relieving factors: Unsure   PATIENT GOALS: To safely improve the use of his right hand and arm, regain sensory function and strength.  NEXT MD VISIT: 04/14/24   OBJECTIVE: (All objective assessments below are from initial evaluation on: 03/17/24 unless otherwise specified.)   HAND DOMINANCE: Right   ADLs: Overall ADLs: States decreased ability to grab, hold household objects, pain and difficulty to open containers, perform FMS  tasks (manipulate fasteners on clothing), perform work tasks, mild to moderate bathing problems as well.    FUNCTIONAL OUTCOME MEASURES: Eval: Quick DASH: TBD % today (higher % = more impairment)    UPPER EXTREMITY ROM     Shoulder to Wrist AROM Right eval Rt 03/25/24 Rt 04/01/24 Rt 04/08/24  Elbow flexion WFL     Elbow extension WFL     Forearm supination ~65  88   Forearm pronation  ~75  81   Wrist flexion NT 15 71   Wrist extension NT 44 56 61  Wrist ulnar deviation  31 34   Wrist radial deviation  15 18    Functional dart thrower's motion (F-DTM) in ulnar flexion      F-DTM in radial extension       (Blank rows = not tested)   Hand AROM Right eval Rt 03/25/24 Rt 04/01/24  Full Fist Ability (or Gap to Distal Palmar Crease) Unable due to overtly stiff and swollen fingers, and thumb Fingers touch palm for loose fist    Thumb Opposition  (Kapandji Scale)  4/10 7/10 10/10  Thumb MCP (0-60) TBD 0-80 0 - 101  Thumb IP (0-80)  0-37 0 - 35  Thumb Radial Abduction Span     Thumb Palmar Abduction Span     (Blank rows = not tested)   UPPER EXTREMITY MMT:    Eval:  NT at eval due to recent and still healing injuries. Will be tested when appropriate.   MMT Right 04/08/24  Elbow flexion   Elbow extension   Forearm supination   Forearm pronation   Wrist flexion TBD/5  Wrist extension TBD/5  Wrist ulnar deviation   Wrist radial deviation   (Blank rows = not tested)  HAND FUNCTION: 04/08/24: Grip strength Right: 94 lbs, Left: 108 lbs   COORDINATION: 04/08/24:  9HPT Rt: 32 sec (21 sec is WFL)   SENSATION: 04/08/24:  Rt:  4-10mm S2PD in thumb pad and IF pad, lso can feel light touch in radial and ulnar dorsum of thumb   Eval:  Light touch intact today, Semmes Weinstein monofilament test does test 2.83 all around the thumb volarly and dorsally, though 2-point discrimination is not intact yet   OBSERVATIONS:   Eval: Surgical site clean and covered with Steri-Strips, sensation appears intact and healing, overt stiffness, weakness and decreased coordination for now.  Fingers very stiff  Right thumb DSRN repair   TODAY'S TREATMENT:  04/08/24: To help cope with remaining nerve paresthesia, OT upgrades his home exercises to the bolded exercises, nerve glides, activities below.  He tolerates all of these well today without pain and he is amazed that they do not hurt him.  They actually decrease his pain that he was feeling at rest today and at the end of the session he states no pain.  Again we  discussed safety and self-care, continuing in hand manipulation skills, not lifting up anything heavier than 10 pounds or anything that is painful.    Exercises - Wrist Flexion Stretch  - 4 x daily - 3-5 reps - 15 sec hold - Wrist Prayer Stretch  - 4 x daily - 3-5 reps - 15 sec hold - Thumb stretch  - 4 x daily - 3-5 reps - 15-20 sec hold - Standing Radial Nerve Glide  - 4-6 x daily - 5 reps - Towel Roll Grip with Forearm in Neutral  - 3 x daily - 5 reps - 10 sec hold - 3-Point Pinch  with Putty  - 3 x daily - 5 reps - 10 sec hold - Wrist Extension with Resistance  - 2-4 x daily - 1-2 sets - 10-15 reps - Wrist Flexion with Resistance  - 2-4 x daily - 1-2 sets - 10-15 reps - Hammer Stretch or Strength   - 2-4 x daily - 1-2 sets - 10-15 reps   In-Hand Manipulation Skills Rotation:  Hold pen, try to "twirl" like a baton, keeping parallel (or flat) with surface of table. Try going BOTH directions 10x  Flip:  Hold pen in writing position,  flip in an arch to "erase" position, then back to "write" position. Do not lift hand off table.  10x  Translation:  Open hand palm up,  put an object in your palm and then use your fingers and thumb to move it to the tips of your fingers, pinched against your thumb. (bigger is easier (fat marker), smaller is harder (penny)) 10x  Shift:  Hold pen like a dart, start "shifting" it forward & backwards from tip to base (like putting a key in a key hole) 10x      PATIENT EDUCATION: Education details: See tx section above for details  Person educated: Patient Education method: Verbal Instruction, Teach back, Handouts  Education comprehension: States and demonstrates understanding, Additional Education required    HOME EXERCISE PROGRAM: Access Code: [email or web address] URL: [email or web address] Date: 03/17/2024 Prepared by: Melvenia Ada   GOALS: Goals reviewed with patient? Yes   SHORT TERM GOALS: (STG required if POC>30 days) Target Date:  04/03/24  Pt will obtain protective, custom orthotic. Goal status: 03/17/2024: Met  2.  Pt will demo/state understanding of initial HEP to improve pain levels and prerequisite motion. Goal status: INITIAL   LONG TERM GOALS: Target Date: 05/01/24  Pt will improve functional ability by decreased impairment per PSFS assessment from TBD to TBD or better, for better quality of life. Goal status: INITIAL-TBD baseline measures next session as time allows  2.  Pt will improve grip strength in right hand from unsafe to test to at least 40 lbs for functional use at home and in IADLs. Goal status: INITIAL  3.  Pt will improve A/ROM in right wrist flexion/extension from unsafe to test to at least 60 degrees each, to have functional motion for tasks like reach and grasp.  Goal status: INITIAL  4.  Pt will improve strength in right wrist flexion/extension from unsafe to test to at least 4+/5 MMT to have increased functional ability to carry out selfcare and higher-level homecare tasks with less difficulty. Goal status: INITIAL  5.  Pt will improve coordination skills in right hand and arm, as seen by within functional limit score on nine-hole peg testing to have increased functional ability to carry out fine motor tasks (fasteners, etc.) and more complex, coordinated IADLs (meal prep, sports, etc.).  Goal status: INITIAL  6.  Pt will decrease pain at worst from 7/10 to 3/10 or better to have better sleep and occupational participation in daily roles. Goal status: INITIAL   ASSESSMENT:  CLINICAL IMPRESSION: 04/08/24: He was having some nerve pain and paresthesia at rest today, but this improved through exercises and isometric strength activities today.  He should be okay to discharge therapy in the next 1-2 visits if symptoms resolve-though he should remain limited weightbearing at work for as long as the doctor advises.   04/01/24: Doing well with stretches, but still having some anxiousness,  some numbness, and he was told to wean  out of his orthosis slowly but as soon as able.   03/25/24: He is making improvements though he is having some nerve pain which is to be expected after a nerve injury and a nerve repair.  His girlfriend states that he has been trying to use his hand more than he is allowed to, though he states he has been careful.  Overall he does seem to be progressing as typical  Eval: Patient is a 23 y.o. male who was seen today for occupational therapy evaluation for stiffness, weakness, swelling, decreased functional ability and decreased safety awareness after laceration and dorsal sensory radial nerve repair 2 weeks ago.  The patient will benefit from outpatient occupational therapy to decrease symptoms, improve functional upper extremity use, and increase quality of life.    PLAN:  OT FREQUENCY: 1-2x/week  OT DURATION: 6 weeks through 05/01/24 and up to 10 total visits as needed   PLANNED INTERVENTIONS: 97535 self care/ADL training, 02889 therapeutic exercise, 97530 therapeutic activity, 97112 neuromuscular re-education, 97140 manual therapy, 97035 ultrasound, 97032 electrical stimulation (manual), 97760 Orthotic Initial, [id number] Orthotic/Prosthetic subsequent, compression bandaging, Dry needling, energy conservation, coping strategies training, and patient/family education  CONSULTED AND AGREED WITH PLAN OF CARE: Patient  PLAN FOR NEXT SESSION:   Check strength, check pain, check coordination and goals.  Try cupping if time allows  Melvenia Ada, OTR/L, CHT  04/08/2024, 9:20 AM

## 2024-04-08 ENCOUNTER — Encounter: Payer: Self-pay | Admitting: Rehabilitative and Restorative Service Providers"

## 2024-04-08 ENCOUNTER — Ambulatory Visit (INDEPENDENT_AMBULATORY_CARE_PROVIDER_SITE_OTHER): Admitting: Rehabilitative and Restorative Service Providers"

## 2024-04-08 DIAGNOSIS — R600 Localized edema: Secondary | ICD-10-CM | POA: Diagnosis not present

## 2024-04-08 DIAGNOSIS — R202 Paresthesia of skin: Secondary | ICD-10-CM | POA: Diagnosis not present

## 2024-04-08 DIAGNOSIS — M25531 Pain in right wrist: Secondary | ICD-10-CM

## 2024-04-08 DIAGNOSIS — M6281 Muscle weakness (generalized): Secondary | ICD-10-CM

## 2024-04-10 NOTE — Therapy (Addendum)
 OUTPATIENT OCCUPATIONAL THERAPY TREATMENT AND DISCHARGE NOTE  Patient Name: Jose Ho MRN: [id number] DOB:2000/11/18, 23 y.o., male Today's Date: 04/13/2024  PCP: [facility] MD REFERRING PROVIDER:  Arlinda Buster, MD                      OCCUPATIONAL THERAPY DISCHARGE SUMMARY  Visits from Start of Care: 5  04/27/24: OT called patient to discuss missed (no show) appointment.  He also had to cancel last week because he states he had illness and poison oak infection.  OT spoke with pt about discharging therapy today. He states he would be comfortable with that, at this point.   He was recommended to continue HEP, desensitization, strengthening for a month, to anticipate full return to duty after his next doctor's appointment.  2 weeks ago he had over 100 pounds of grip strength, and this should just continue to improve.  Please see note below for additional details.  Education / Equipment: Pt has all needed materials and education. Pt understands how to continue on with self-management. See tx notes for more details.   Patient agrees to discharge due to max benefits received from outpatient occupational therapy / hand therapy at this time.   Melvenia Ada, OTR/L, CHT 04/27/24                   END OF SESSION:  OT End of Session - 04/13/24 0930     Visit Number 5    Number of Visits 10    Date for Recertification  05/01/24    Authorization Type [employer]et[employer]    OT Start Time 0930    OT Stop Time 1014    OT Time Calculation (min) 44 min    Activity Tolerance Patient tolerated treatment well;No increased pain;Patient limited by fatigue;Patient limited by pain    Behavior During Therapy [facility] for tasks assessed/performed          Past Medical History:  Diagnosis Date   Anxiety    Past Surgical History:  Procedure Laterality Date   MOUTH SURGERY     NERVE REPAIR Right 03/03/2024   Procedure: DORSAL RADIAL SENSORY NERVE REPAIR;   Surgeon: Arlinda Buster, MD;  Location: [facility];  Service: Orthopedics;  Laterality: Right;   WOUND EXPLORATION Right 03/03/2024   Procedure: RIGHT WRIST WOUND EXPLORATION;  Surgeon: Arlinda Buster, MD;  Location: [facility];  Service: Orthopedics;  Laterality: Right;   Patient Active Problem List   Diagnosis Date Noted   Laceration of skin and cutaneous sensory nerve of right upper extremity 03/03/2024    ONSET DATE: DOS 03/03/24  REFERRING DIAG: D35.68KJ (ICD-10-CM) - Laceration of digital nerve of right thumb, initial encounter   THERAPY DIAG:  Localized edema  Muscle weakness (generalized)  Pain in right wrist  Paresthesia of skin  Rationale for Evaluation and Treatment: Rehabilitation  PERTINENT HISTORY: None related to this condition The original injury took place approx 3 weeks ago.   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  He states that I want to do this correctly, because I was rushed through a prior leg injury and now I have back pain chronically.  However, he does admit to trying to lift things while in his cast, which is inappropriate.  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 He states new strengthening was tolerated well. He has no resting pain today.     PAIN:  Are you having pain? None at rest today    PATIENT GOALS: To safely improve the use of his right hand and arm, regain sensory function and strength.  NEXT MD VISIT: 04/14/24   OBJECTIVE: (All objective assessments below are from initial evaluation on: 03/17/24 unless otherwise specified.)   HAND DOMINANCE: Right   ADLs: Overall ADLs: States decreased ability to  grab, hold household objects, pain and difficulty to open containers, perform FMS tasks (manipulate fasteners on clothing), perform work tasks, mild to moderate bathing problems as well.    FUNCTIONAL OUTCOME MEASURES: 04/13/24: Quick DASH: 50 % today (higher % = more impairment)    UPPER EXTREMITY ROM     Shoulder to Wrist AROM Right eval Rt 03/25/24 Rt 04/01/24 Rt 04/08/24  Elbow flexion WFL     Elbow extension WFL     Forearm supination ~65  88   Forearm pronation  ~75  81   Wrist flexion NT 15 71   Wrist extension NT 44 56 61  Wrist ulnar deviation  31 34   Wrist radial deviation  15 18   Functional dart thrower's motion (F-DTM) in ulnar flexion      F-DTM in radial extension       (Blank rows = not tested)   Hand AROM Right eval Rt 03/25/24 Rt 04/01/24  Full Fist Ability (or Gap to Distal Palmar Crease) Unable due to overtly stiff and swollen fingers, and thumb Fingers touch palm for loose fist    Thumb Opposition  (Kapandji Scale)  4/10 7/10 10/10  Thumb MCP (0-60) TBD 0-80 0 - 101  Thumb IP (0-80)  0-37 0 - 35  Thumb Radial Abduction Span     Thumb Palmar Abduction Span     (Blank rows = not tested)   UPPER EXTREMITY MMT:    Eval:  NT at eval due to recent and still healing injuries. Will be tested when appropriate.   MMT Right 04/13/24  Elbow flexion   Elbow extension   Forearm supination 5/5  Forearm pronation 5/5  Wrist flexion 4/5  Wrist extension 4+/5  Wrist ulnar deviation   Wrist radial deviation   (Blank rows = not tested)  HAND FUNCTION: 04/13/24: Grip Rt: 102.6#    04/08/24: Grip strength Right: 94 lbs, Left: 108 lbs   COORDINATION: 04/13/24: 9HPT: Rt 25 sec    04/08/24:  9HPT Rt: 32 sec (21 sec is WFL)   SENSATION: 04/08/24:  Rt:  4-34mm S2PD in thumb pad and IF pad, lso can feel light touch in radial and ulnar dorsum of thumb   Eval:  Light touch intact today, Semmes Weinstein monofilament test does test 2.83 all around the thumb  volarly and dorsally, though 2-point discrimination is not intact yet   OBSERVATIONS:   Eval: Surgical site clean and covered with Steri-Strips, sensation appears intact and healing, overt stiffness, weakness and decreased coordination for now.  Fingers very stiff  Right thumb DSRN repair   TODAY'S TREATMENT:  04/13/24:    We discuss his fnl activity with Quick DASH while his hand is in fluidotherapy for desensitization.  We then check his motion and strength, reviewing new PRE for hand/wrist.  We upgraded his PRE to include  hand weights up to 6 pounds with wrist flexion, 4 pounds and wrist extension, 3 pounds with wrist radial deviation.  He also uses the bold board for fine motor skills and pinch strength and has exemplary performance.  OT strongly encourages him to keep working out 3 times a day progressively and slowly with light weight versus possible heavy weight on himself to see how much he can take.  He does state doing that over the past week, having a 50 pound sack of corn put on the shoulder and collapsing.  This is not good or safe training.  States understanding his stretches and exercises at the end of the session and leaves in no pain.   Exercises reviewed today: - Wrist Flexion Stretch  - 4 x daily - 3-5 reps - 15 sec hold - Wrist Prayer Stretch  - 4 x daily - 3-5 reps - 15 sec hold - Thumb stretch  - 4 x daily - 3-5 reps - 15-20 sec hold - Standing Radial Nerve Glide  - 4-6 x daily - 5 reps - Towel Roll Grip with Forearm in Neutral  - 3 x daily - 5 reps - 10 sec hold - 3-Point Pinch with Putty  - 3 x daily - 5 reps - 10 sec hold - Wrist Extension with Resistance  - 2-4 x daily - 1-2 sets - 10-15 reps - Wrist Flexion with Resistance  - 2-4 x daily - 1-2 sets - 10-15 reps - Hammer Stretch or Strength   - 2-4 x daily - 1-2 sets - 10-15 reps   In-Hand Manipulation Skills Rotation:  Hold pen, try to "twirl" like a baton, keeping parallel (or flat) with surface of table.  Try going BOTH directions 10x  Flip:  Hold pen in writing position,  flip in an arch to "erase" position, then back to "write" position. Do not lift hand off table.  10x  Translation:  Open hand palm up,  put an object in your palm and then use your fingers and thumb to move it to the tips of your fingers, pinched against your thumb. (bigger is easier (fat marker), smaller is harder (penny)) 10x  Shift:  Hold pen like a dart, start "shifting" it forward & backwards from tip to base (like putting a key in a key hole) 10x      PATIENT EDUCATION: Education details: See tx section above for details  Person educated: Patient Education method: Verbal Instruction, Teach back, Handouts  Education comprehension: States and demonstrates understanding, Additional Education required    HOME EXERCISE PROGRAM: Access Code: [email or web address] URL: [email or web address] Date: 03/17/2024 Prepared by: Melvenia Ada   GOALS: Goals reviewed with patient? Yes   SHORT TERM GOALS: (STG required if POC>30 days) Target Date: 04/03/24  Pt will obtain protective, custom orthotic. Goal status: 03/17/2024: Met  2.  Pt will demo/state understanding of initial HEP to improve pain levels and prerequisite motion. Goal status: INITIAL   LONG TERM GOALS: Target Date: 05/01/24  Pt will improve functional ability by decreased impairment per PSFS assessment from TBD to TBD or better, for better quality of life. Goal status: INITIAL-TBD baseline measures next session as time allows  2.  Pt will improve grip strength in right hand from unsafe to test to at least 40 lbs for functional use at home and in IADLs. Goal status: INITIAL  3.  Pt will improve A/ROM in right wrist flexion/extension from unsafe to test to at least 60 degrees each, to have functional  motion for tasks like reach and grasp.  Goal status: INITIAL  4.  Pt will improve strength in right wrist flexion/extension from unsafe to test to  at least 4+/5 MMT to have increased functional ability to carry out selfcare and higher-level homecare tasks with less difficulty. Goal status: INITIAL  5.  Pt will improve coordination skills in right hand and arm, as seen by within functional limit score on nine-hole peg testing to have increased functional ability to carry out fine motor tasks (fasteners, etc.) and more complex, coordinated IADLs (meal prep, sports, etc.).  Goal status: INITIAL  6.  Pt will decrease pain at worst from 7/10 to 3/10 or better to have better sleep and occupational participation in daily roles. Goal status: INITIAL   ASSESSMENT:  CLINICAL IMPRESSION: 11/101/25: Now is only 8# off from Rt hand to Left hand, now improving FMS and pain, still need some strength and coord to build confidence     PLAN:  OT FREQUENCY: 1-2x/week  OT DURATION: 6 weeks through 05/01/24 and up to 10 total visits as needed   PLANNED INTERVENTIONS: 97535 self care/ADL training, 02889 therapeutic exercise, 97530 therapeutic activity, 97112 neuromuscular re-education, 97140 manual therapy, 97035 ultrasound, 97032 electrical stimulation (manual), 97760 Orthotic Initial, [id number] Orthotic/Prosthetic subsequent, compression bandaging, Dry needling, energy conservation, coping strategies training, and patient/family education  CONSULTED AND AGREED WITH PLAN OF CARE: Patient  PLAN FOR NEXT SESSION:  discharge    Melvenia Ada, OTR/L, CHT  04/13/2024, 10:19 AM

## 2024-04-13 ENCOUNTER — Encounter: Payer: Self-pay | Admitting: Rehabilitative and Restorative Service Providers"

## 2024-04-13 ENCOUNTER — Ambulatory Visit: Admitting: Rehabilitative and Restorative Service Providers"

## 2024-04-13 ENCOUNTER — Ambulatory Visit (INDEPENDENT_AMBULATORY_CARE_PROVIDER_SITE_OTHER): Admitting: Orthopedic Surgery

## 2024-04-13 DIAGNOSIS — M6281 Muscle weakness (generalized): Secondary | ICD-10-CM

## 2024-04-13 DIAGNOSIS — S648X1D Injury of other nerves at wrist and hand level of right arm, subsequent encounter: Secondary | ICD-10-CM

## 2024-04-13 DIAGNOSIS — R202 Paresthesia of skin: Secondary | ICD-10-CM | POA: Diagnosis not present

## 2024-04-13 DIAGNOSIS — R600 Localized edema: Secondary | ICD-10-CM | POA: Diagnosis not present

## 2024-04-13 DIAGNOSIS — M25531 Pain in right wrist: Secondary | ICD-10-CM

## 2024-04-13 NOTE — Progress Notes (Signed)
   Jose Ho - 23 y.o. male MRN [id number]  Date of birth: May 17, 2001  Office Visit Note: Visit Date: 04/13/2024 PCP: Marvine Rush, MD Referred by: Marvine Rush, MD  Subjective:  HPI: Jose Ho is a 23 y.o. male who presents today for follow up 6 weeks status post right wrist penetrating wound exploration and dorsal radial sensory nerve repair.  Having ongoing numbness in the right thumb with hypersensitivity around the incisional site.  Has been doing occupational therapy as instructed.  Pertinent ROS were reviewed with the patient and found to be negative unless otherwise specified above in HPI.   Assessment & Plan: Visit Diagnoses:  1. 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  I discussed with him that the hypersensitivity and diffuse numbness in the region of the nerve repair is normal for this timeline.  He should continue with occupational therapy exercises as instructed.  Refrain from heavy use of the right hand, work restrictions were placed today with a maximum lifting of 5 pounds, brace at all times.  Follow-up: No follow-ups on file.   Meds & Orders: No orders of the defined types were placed in this encounter.  No orders of the defined types were placed in this encounter.    Procedures: No procedures performed       Objective:   Vital Signs: There were no vitals taken for this visit.  Ortho Exam Right wrist with well healing incisional site, sensory limited to light touch DRSN, mildly positive advancing tinels, thumb opposition to ring finger PIP, pinch strength not tested today  Imaging: No results found.   Kandra Graven Afton Alderton, M.D. [facility], Hand Surgery

## 2024-04-14 ENCOUNTER — Encounter: Admitting: Orthopedic Surgery

## 2024-04-15 NOTE — Therapy (Incomplete)
 OUTPATIENT OCCUPATIONAL THERAPY TREATMENT NOTE  Patient Name: Jose Ho MRN: [id number] DOB:Jun 30, 2000, 23 y.o., male Today's Date: 04/15/2024  PCP: [facility] MD REFERRING PROVIDER:  Arlinda Buster, MD    END OF SESSION:    Past Medical History:  Diagnosis Date   Anxiety    Past Surgical History:  Procedure Laterality Date   MOUTH SURGERY     NERVE REPAIR Right 03/03/2024   Procedure: DORSAL RADIAL SENSORY NERVE REPAIR;  Surgeon: Arlinda Buster, MD;  Location: [facility];  Service: Orthopedics;  Laterality: Right;   WOUND EXPLORATION Right 03/03/2024   Procedure: RIGHT WRIST WOUND EXPLORATION;  Surgeon: Arlinda Buster, MD;  Location: [facility];  Service: Orthopedics;  Laterality: Right;   Patient Active Problem List   Diagnosis Date Noted   Laceration of skin and cutaneous sensory nerve of right upper extremity 03/03/2024    ONSET DATE: DOS 03/03/24  REFERRING DIAG: D35.68KJ (ICD-10-CM) - Laceration of digital nerve of right thumb, initial encounter   THERAPY DIAG:  No diagnosis found.  Rationale for Evaluation and Treatment: Rehabilitation  PERTINENT HISTORY: None related to this condition The original injury took place approx 3 weeks ago.   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  He states that I want to do this correctly, because I was rushed through a prior leg injury and now I have back pain chronically.  However, he does admit to trying to lift things while in his cast, which is inappropriate.  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 He states  ***   new strengthening was tolerated well. He has no resting pain today.      PAIN:  Are you having pain? None at rest today    PATIENT GOALS: To safely improve the use of his right hand and arm, regain sensory function and strength.  NEXT MD VISIT: 04/14/24   OBJECTIVE: (All objective assessments below are from initial evaluation on: 03/17/24 unless otherwise specified.)   HAND DOMINANCE: Right   ADLs: Overall ADLs: States decreased ability to grab, hold household objects, pain and difficulty to open containers, perform FMS tasks (manipulate fasteners on clothing), perform work tasks, mild to moderate bathing problems as well.    FUNCTIONAL OUTCOME MEASURES: 04/13/24: Quick DASH: 50 % today (higher % = more impairment)    UPPER EXTREMITY ROM     Shoulder to Wrist AROM Right eval Rt 03/25/24 Rt 04/01/24 Rt 04/08/24  Elbow flexion WFL     Elbow extension WFL     Forearm supination ~65  88   Forearm pronation  ~75  81   Wrist flexion NT 15 71   Wrist extension NT 44 56 61  Wrist ulnar deviation  31 34   Wrist radial deviation  15 18   Functional dart thrower's motion (F-DTM) in ulnar flexion      F-DTM in radial extension       (Blank rows = not tested)   Hand AROM Right eval Rt 03/25/24 Rt 04/01/24  Full Fist Ability (or Gap to Distal Palmar Crease) Unable due to overtly stiff and swollen fingers, and thumb Fingers touch palm for loose fist  Thumb Opposition  (Kapandji Scale)  4/10 7/10 10/10  Thumb MCP (0-60) TBD 0-80 0 - 101  Thumb IP (0-80)  0-37 0 - 35  Thumb Radial Abduction Span     Thumb Palmar Abduction Span     (Blank rows = not tested)   UPPER EXTREMITY MMT:    Eval:  NT at eval due to recent and still healing injuries. Will be tested when appropriate.   MMT Right 04/13/24  Elbow flexion   Elbow extension   Forearm supination 5/5  Forearm pronation 5/5  Wrist flexion 4/5  Wrist extension 4+/5  Wrist ulnar deviation   Wrist radial deviation   (Blank rows = not tested)  HAND FUNCTION: 04/13/24: Grip Rt:  102.6#    04/08/24: Grip strength Right: 94 lbs, Left: 108 lbs   COORDINATION: 04/13/24: 9HPT: Rt 25 sec    04/08/24:  9HPT Rt: 32 sec (21 sec is WFL)   SENSATION: 04/08/24:  Rt:  4-39mm S2PD in thumb pad and IF pad, lso can feel light touch in radial and ulnar dorsum of thumb   Eval:  Light touch intact today, Semmes Weinstein monofilament test does test 2.83 all around the thumb volarly and dorsally, though 2-point discrimination is not intact yet   OBSERVATIONS:   Eval: Surgical site clean and covered with Steri-Strips, sensation appears intact and healing, overt stiffness, weakness and decreased coordination for now.  Fingers very stiff  Right thumb DSRN repair   TODAY'S TREATMENT:  04/20/24: *** Continue strength and discharge in 1 -2 weeks     04/13/24:    We discuss his fnl activity with Quick DASH while his hand is in fluidotherapy for desensitization.  We then check his motion and strength, reviewing new PRE for hand/wrist.  We upgraded his PRE to include hand weights up to 6 pounds with wrist flexion, 4 pounds and wrist extension, 3 pounds with wrist radial deviation.  He also uses the bold board for fine motor skills and pinch strength and has exemplary performance.  OT strongly encourages him to keep working out 3 times a day progressively and slowly with light weight versus possible heavy weight on himself to see how much he can take.  He does state doing that over the past week, having a 50 pound sack of corn put on the shoulder and collapsing.  This is not good or safe training.  States understanding his stretches and exercises at the end of the session and leaves in no pain.   Exercises reviewed today: - Wrist Flexion Stretch  - 4 x daily - 3-5 reps - 15 sec hold - Wrist Prayer Stretch  - 4 x daily - 3-5 reps - 15 sec hold - Thumb stretch  - 4 x daily - 3-5 reps - 15-20 sec hold - Standing Radial Nerve Glide  - 4-6 x daily - 5 reps - Towel Roll Grip with Forearm  in Neutral  - 3 x daily - 5 reps - 10 sec hold - 3-Point Pinch with Putty  - 3 x daily - 5 reps - 10 sec hold - Wrist Extension with Resistance  - 2-4 x daily - 1-2 sets - 10-15 reps - Wrist Flexion with Resistance  - 2-4 x daily - 1-2 sets - 10-15 reps - Hammer Stretch or Strength   - 2-4 x daily - 1-2 sets - 10-15 reps   In-Hand Manipulation Skills Rotation:  Hold pen, try to "twirl" like a baton, keeping parallel (or flat)  with surface of table. Try going BOTH directions 10x  Flip:  Hold pen in writing position,  flip in an arch to "erase" position, then back to "write" position. Do not lift hand off table.  10x  Translation:  Open hand palm up,  put an object in your palm and then use your fingers and thumb to move it to the tips of your fingers, pinched against your thumb. (bigger is easier (fat marker), smaller is harder (penny)) 10x  Shift:  Hold pen like a dart, start "shifting" it forward & backwards from tip to base (like putting a key in a key hole) 10x      PATIENT EDUCATION: Education details: See tx section above for details  Person educated: Patient Education method: Verbal Instruction, Teach back, Handouts  Education comprehension: States and demonstrates understanding, Additional Education required    HOME EXERCISE PROGRAM: Access Code: [email or web address] URL: [email or web address] Date: 03/17/2024 Prepared by: Melvenia Ada   GOALS: Goals reviewed with patient? Yes   SHORT TERM GOALS: (STG required if POC>30 days) Target Date: 04/03/24  Pt will obtain protective, custom orthotic. Goal status: 03/17/2024: Met  2.  Pt will demo/state understanding of initial HEP to improve pain levels and prerequisite motion. Goal status: INITIAL   LONG TERM GOALS: Target Date: 05/01/24  Pt will improve functional ability by decreased impairment per PSFS assessment from TBD to TBD or better, for better quality of life. Goal status: INITIAL-TBD baseline  measures next session as time allows  2.  Pt will improve grip strength in right hand from unsafe to test to at least 40 lbs for functional use at home and in IADLs. Goal status: INITIAL  3.  Pt will improve A/ROM in right wrist flexion/extension from unsafe to test to at least 60 degrees each, to have functional motion for tasks like reach and grasp.  Goal status: INITIAL  4.  Pt will improve strength in right wrist flexion/extension from unsafe to test to at least 4+/5 MMT to have increased functional ability to carry out selfcare and higher-level homecare tasks with less difficulty. Goal status: INITIAL  5.  Pt will improve coordination skills in right hand and arm, as seen by within functional limit score on nine-hole peg testing to have increased functional ability to carry out fine motor tasks (fasteners, etc.) and more complex, coordinated IADLs (meal prep, sports, etc.).  Goal status: INITIAL  6.  Pt will decrease pain at worst from 7/10 to 3/10 or better to have better sleep and occupational participation in daily roles. Goal status: INITIAL   ASSESSMENT:  CLINICAL IMPRESSION: 04/20/24: ***   11/101/25: Now is only 8# off from Rt hand to Left hand, now improving FMS and pain, still need some strength and coord to build confidence     PLAN:  OT FREQUENCY: 1-2x/week  OT DURATION: 6 weeks through 05/01/24 and up to 10 total visits as needed   PLANNED INTERVENTIONS: 97535 self care/ADL training, 02889 therapeutic exercise, 97530 therapeutic activity, 97112 neuromuscular re-education, 97140 manual therapy, 97035 ultrasound, 97032 electrical stimulation (manual), 97760 Orthotic Initial, [id number] Orthotic/Prosthetic subsequent, compression bandaging, Dry needling, energy conservation, coping strategies training, and patient/family education  CONSULTED AND AGREED WITH PLAN OF CARE: Patient  PLAN FOR NEXT SESSION:   ***   Melvenia Ada, OTR/L, CHT  04/15/2024, 2:20 PM

## 2024-04-19 ENCOUNTER — Ambulatory Visit
Admission: RE | Admit: 2024-04-19 | Discharge: 2024-04-19 | Disposition: A | Discharge status: Home / Self Care | Source: Ambulatory Visit | Attending: Physical Medicine and Rehabilitation | Admitting: Physical Medicine and Rehabilitation

## 2024-04-19 DIAGNOSIS — M7918 Myalgia, other site: Secondary | ICD-10-CM

## 2024-04-19 DIAGNOSIS — G8929 Other chronic pain: Secondary | ICD-10-CM

## 2024-04-20 ENCOUNTER — Ambulatory Visit: Payer: Self-pay

## 2024-04-20 ENCOUNTER — Encounter: Admitting: Rehabilitative and Restorative Service Providers"

## 2024-04-20 NOTE — Therapy (Incomplete)
 OUTPATIENT PHYSICAL THERAPY THORACOLUMBAR EVALUATION   Patient Name: Jose Ho MRN: [id number] DOB:2000/06/08, 23 y.o., male Today's Date: 04/20/2024  END OF SESSION:   Past Medical History:  Diagnosis Date   Anxiety    Past Surgical History:  Procedure Laterality Date   MOUTH SURGERY     NERVE REPAIR Right 03/03/2024   Procedure: DORSAL RADIAL SENSORY NERVE REPAIR;  Surgeon: Arlinda Buster, MD;  Location: [facility];  Service: Orthopedics;  Laterality: Right;   WOUND EXPLORATION Right 03/03/2024   Procedure: RIGHT WRIST WOUND EXPLORATION;  Surgeon: Arlinda Buster, MD;  Location: [facility];  Service: Orthopedics;  Laterality: Right;   Patient Active Problem List   Diagnosis Date Noted   Laceration of skin and cutaneous sensory nerve of right upper extremity 03/03/2024    PCP: [facility], MD   REFERRING PROVIDER: Trudy Duwaine BRAVO, NP   REFERRING DIAG:  [phone] (ICD-10-CM) - Chronic bilateral low back pain without sciatica  M79.18 (ICD-10-CM) - Myofascial pain syndrome    Rationale for Evaluation and Treatment: Rehabilitation  THERAPY DIAG:  No diagnosis found.  ONSET DATE: ***  SUBJECTIVE:                                                                                                                                                                                           SUBJECTIVE STATEMENT: ***  PERTINENT HISTORY:  ***  PAIN:  Are you having pain? Yes: NPRS scale: *** Pain location: *** Pain description: *** Aggravating factors: *** Relieving factors: ***  PRECAUTIONS: {Therapy precautions:24002}  RED FLAGS: {PT Red Flags:29287}   WEIGHT BEARING RESTRICTIONS: {Yes ***/No:24003}  FALLS:  Has patient fallen in last 6 months? {fallsyesno:27318}  LIVING ENVIRONMENT: Lives with: {[facility] lives with:25569::lives with their family} Lives in: {Lives in:25570} Stairs: {opstairs:27293}  OCCUPATION:  ***  PLOF: {PLOF:24004}  PATIENT GOALS: ***  NEXT MD VISIT: ***  OBJECTIVE:  Note: Objective measures were completed at Evaluation unless otherwise noted.  DIAGNOSTIC FINDINGS:  MRI lumbar 04/19/24***  PATIENT SURVEYS:  PSFS: THE PATIENT SPECIFIC FUNCTIONAL SCALE  Place score of 0-10 (0 = unable to perform activity and 10 = able to perform activity at the same level as before injury or problem)  Activity Date: 04/22/24         2.     3.     4.      Total Score ***      Total Score = Sum of activity scores/number of activities  Minimally Detectable Change: 3 points (for single activity); 2 points (for average score)  [facility] (nd). The Patient Specific  Functional Scale . Retrieved from [email or web address]   COGNITION: Overall cognitive status: Within functional limits for tasks assessed     SENSATION: {sensation:27233}  MUSCLE LENGTH: Hamstrings: Right *** deg; Left *** deg Debby test: Right *** deg; Left *** deg  POSTURE: {posture:25561}  PALPATION: ***  LUMBAR ROM:   AROM eval  Flexion   Extension   Right lateral flexion   Left lateral flexion   Right rotation   Left rotation    (Blank rows = not tested)  LOWER EXTREMITY ROM:     Active  Right eval Left eval  Hip flexion    Hip extension    Hip abduction    Hip adduction    Hip internal rotation    Hip external rotation    Knee flexion    Knee extension    Ankle dorsiflexion    Ankle plantarflexion    Ankle inversion    Ankle eversion     (Blank rows = not tested)  LOWER EXTREMITY MMT:    MMT Right eval Left eval  Hip flexion    Hip extension    Hip abduction    Hip adduction    Hip internal rotation    Hip external rotation    Knee flexion    Knee extension    Ankle dorsiflexion    Ankle plantarflexion    Ankle inversion    Ankle eversion     (Blank rows = not tested)  LUMBAR SPECIAL TESTS:  Straight  leg raise test: {pos/neg:25243}  FUNCTIONAL TESTS:  5 times sit to stand: ***  GAIT: Distance walked: *** Assistive device utilized: {Assistive devices:23999} Level of assistance: Complete Independence   TREATMENT DATE:  04/23/24 Initial evaluation of lumbar spine completed followed by instruction and trial set of HEP.                                                                                                                                 PATIENT EDUCATION:  Education details: HEP Person educated: Patient Education method: Solicitor, Actor cues, and Verbal cues Education comprehension: verbalized understanding, returned demonstration, and verbal cues required  HOME EXERCISE PROGRAM: ***  ASSESSMENT:  CLINICAL IMPRESSION: Patient is a 23 y.o. male who was seen today for physical therapy evaluation and treatment for LBP. He presents with *** Pt would benefit from skilled PT to address these deficits and return to previous LOA.  OBJECTIVE IMPAIRMENTS: {opptimpairments:25111}.   ACTIVITY LIMITATIONS: {activitylimitations:27494}  PARTICIPATION LIMITATIONS: {participationrestrictions:25113}  PERSONAL FACTORS: {Personal factors:25162} are also affecting patient's functional outcome.   REHAB POTENTIAL: {rehabpotential:25112}  CLINICAL DECISION MAKING: {clinical decision making:25114}  EVALUATION COMPLEXITY: {Evaluation complexity:25115}   GOALS: Goals reviewed with patient? Yes  SHORT TERM GOALS: Target date: ***  Pt to be independent with HEP. Baseline: Goal status: INITIAL  2.  Decrease pain by 1 level. Baseline:  Goal status: INITIAL  3.  *** Baseline:  Goal status: INITIAL  LONG  TERM GOALS: Target date: ***  *** Baseline:  Goal status: INITIAL  2.  *** Baseline:  Goal status: INITIAL  3.  *** Baseline:  Goal status: INITIAL  4.  *** Baseline:  Goal status: INITIAL  5.  *** Baseline:  Goal status: INITIAL  6.   *** Baseline:  Goal status: INITIAL  PLAN:  PT FREQUENCY: {rehab frequency:25116}  PT DURATION: {rehab duration:25117}  PLANNED INTERVENTIONS: {rehab planned interventions:25118::97110-Therapeutic exercises,97530- Therapeutic [phone]- Neuromuscular re-education,97535- Self Rjmz,02859- Manual therapy,Patient/Family education}.  PLAN FOR NEXT SESSION: ***   Burnard CHRISTELLA Meth, PT 04/20/2024, 10:41 AM

## 2024-04-22 ENCOUNTER — Ambulatory Visit

## 2024-04-22 ENCOUNTER — Telehealth: Payer: Self-pay | Admitting: Physical Medicine and Rehabilitation

## 2024-04-22 NOTE — Telephone Encounter (Signed)
 Patient called. He would like the results of his MRI.

## 2024-04-23 NOTE — Telephone Encounter (Signed)
 Patient scheduled 12/5.

## 2024-04-23 NOTE — Therapy (Incomplete)
 OUTPATIENT OCCUPATIONAL THERAPY TREATMENT & *** NOTE  Patient Name: Jose Ho MRN: [id number] DOB:September 15, 2000, 23 y.o., male Today's Date: 04/23/2024  PCP: [facility] MD REFERRING PROVIDER:  Arlinda Buster, MD               ***               END OF SESSION:    Past Medical History:  Diagnosis Date   Anxiety    Past Surgical History:  Procedure Laterality Date   MOUTH SURGERY     NERVE REPAIR Right 03/03/2024   Procedure: DORSAL RADIAL SENSORY NERVE REPAIR;  Surgeon: Arlinda Buster, MD;  Location: [facility];  Service: Orthopedics;  Laterality: Right;   WOUND EXPLORATION Right 03/03/2024   Procedure: RIGHT WRIST WOUND EXPLORATION;  Surgeon: Arlinda Buster, MD;  Location: [facility];  Service: Orthopedics;  Laterality: Right;   Patient Active Problem List   Diagnosis Date Noted   Laceration of skin and cutaneous sensory nerve of right upper extremity 03/03/2024    ONSET DATE: DOS 03/03/24  REFERRING DIAG: D35.68KJ (ICD-10-CM) - Laceration of digital nerve of right thumb, initial encounter   THERAPY DIAG:  No diagnosis found.  Rationale for Evaluation and Treatment: Rehabilitation  PERTINENT HISTORY: None related to this condition The original injury took place approx 3 weeks ago.   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  He states that I want to do this correctly, because I was rushed through a prior leg injury and now I have back pain chronically.  However, he does admit to trying to lift things while in his cast, which is inappropriate.  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 He states  ***   new  strengthening was tolerated well. He has no resting pain today.     PAIN:  Are you having pain? None at rest today    PATIENT GOALS: To safely improve the use of his right hand and arm, regain sensory function and strength.  NEXT MD VISIT: 04/14/24   OBJECTIVE: (All objective assessments below are from initial evaluation on: 03/17/24 unless otherwise specified.)   HAND DOMINANCE: Right   ADLs: Overall ADLs: States decreased ability to grab, hold household objects, pain and difficulty to open containers, perform FMS tasks (manipulate fasteners on clothing), perform work tasks, mild to moderate bathing problems as well.    FUNCTIONAL OUTCOME MEASURES: 04/13/24: Quick DASH: 50 % today (higher % = more impairment)    UPPER EXTREMITY ROM     Shoulder to Wrist AROM Right eval Rt 03/25/24 Rt 04/01/24 Rt 04/08/24  Elbow flexion WFL     Elbow extension WFL     Forearm supination ~65  88   Forearm pronation  ~75  81   Wrist flexion NT 15 71   Wrist extension NT 44 56 61  Wrist ulnar deviation  31 34   Wrist radial deviation  15 18   Functional dart thrower's motion (F-DTM) in ulnar flexion      F-DTM in radial extension       (Blank rows = not tested)   Hand AROM Right eval Rt 03/25/24 Rt 04/01/24  Full Fist Ability (or Gap to Distal Palmar Crease) Unable due to overtly stiff and swollen fingers, and thumb Fingers touch palm for loose fist    Thumb Opposition  (Kapandji Scale)  4/10 7/10 10/10  Thumb MCP (0-60) TBD 0-80 0 - 101  Thumb IP (0-80)  0-37 0 - 35  Thumb Radial Abduction Span     Thumb Palmar Abduction Span     (Blank rows = not tested)   UPPER EXTREMITY MMT:    Eval:  NT at eval due to recent and still healing injuries. Will be tested when appropriate.   MMT Right 04/13/24  Elbow flexion   Elbow extension   Forearm supination 5/5  Forearm pronation 5/5  Wrist flexion 4/5  Wrist extension 4+/5  Wrist ulnar deviation   Wrist radial deviation    (Blank rows = not tested)  HAND FUNCTION: 04/13/24: Grip Rt: 102.6#    04/08/24: Grip strength Right: 94 lbs, Left: 108 lbs   COORDINATION: 04/27/24: 9HPT Rt: *** sec   04/13/24: 9HPT: Rt 25 sec    04/08/24:  9HPT Rt: 32 sec (21 sec is WFL)   SENSATION: 04/08/24:  Rt:  4-36mm S2PD in thumb pad and IF pad, lso can feel light touch in radial and ulnar dorsum of thumb   Eval:  Light touch intact today, Semmes Weinstein monofilament test does test 2.83 all around the thumb volarly and dorsally, though 2-point discrimination is not intact yet   OBSERVATIONS:   Eval: Surgical site clean and covered with Steri-Strips, sensation appears intact and healing, overt stiffness, weakness and decreased coordination for now.  Fingers very stiff  Right thumb DSRN repair   TODAY'S TREATMENT:  04/27/24: *** Continue strength and discharge in 1 -2 weeks     04/13/24:    We discuss his fnl activity with Quick DASH while his hand is in fluidotherapy for desensitization.  We then check his motion and strength, reviewing new PRE for hand/wrist.  We upgraded his PRE to include hand weights up to 6 pounds with wrist flexion, 4 pounds and wrist extension, 3 pounds with wrist radial deviation.  He also uses the bold board for fine motor skills and pinch strength and has exemplary performance.  OT strongly encourages him to keep working out 3 times a day progressively and slowly with light weight versus possible heavy weight on himself to see how much he can take.  He does state doing that over the past week, having a 50 pound sack of corn put on the shoulder and collapsing.  This is not good or safe training.  States understanding his stretches and exercises at the end of the session and leaves in no pain.   Exercises reviewed today: - Wrist Flexion Stretch  - 4 x daily - 3-5 reps - 15 sec hold - Wrist Prayer Stretch  - 4 x daily - 3-5 reps - 15 sec hold - Thumb stretch  - 4 x daily - 3-5 reps - 15-20  sec hold - Standing Radial Nerve Glide  - 4-6 x daily - 5 reps - Towel Roll Grip with Forearm in Neutral  - 3 x daily - 5 reps - 10 sec hold - 3-Point Pinch with Putty  - 3 x daily - 5 reps - 10 sec hold - Wrist Extension with Resistance  - 2-4 x daily - 1-2 sets - 10-15 reps - Wrist Flexion with Resistance  - 2-4 x daily - 1-2 sets - 10-15 reps -  Hammer Stretch or Strength   - 2-4 x daily - 1-2 sets - 10-15 reps   In-Hand Manipulation Skills Rotation:  Hold pen, try to "twirl" like a baton, keeping parallel (or flat) with surface of table. Try going BOTH directions 10x  Flip:  Hold pen in writing position,  flip in an arch to "erase" position, then back to "write" position. Do not lift hand off table.  10x  Translation:  Open hand palm up,  put an object in your palm and then use your fingers and thumb to move it to the tips of your fingers, pinched against your thumb. (bigger is easier (fat marker), smaller is harder (penny)) 10x  Shift:  Hold pen like a dart, start "shifting" it forward & backwards from tip to base (like putting a key in a key hole) 10x      PATIENT EDUCATION: Education details: See tx section above for details  Person educated: Patient Education method: Verbal Instruction, Teach back, Handouts  Education comprehension: States and demonstrates understanding, Additional Education required    HOME EXERCISE PROGRAM: Access Code: [email or web address] URL: [email or web address] Date: 03/17/2024 Prepared by: Melvenia Ada   GOALS: Goals reviewed with patient? Yes   SHORT TERM GOALS: (STG required if POC>30 days) Target Date: 04/03/24  Pt will obtain protective, custom orthotic. Goal status: 03/17/2024: Met  2.  Pt will demo/state understanding of initial HEP to improve pain levels and prerequisite motion. Goal status: 04/27/24:  MET   LONG TERM GOALS: Target Date: 05/01/24  Pt will improve functional ability by decreased impairment per PSFS  assessment from TBD to TBD or better, for better quality of life. Goal status: 04/27/24: ***  2.  Pt will improve grip strength in right hand from unsafe to test to at least 40 lbs for functional use at home and in IADLs. Goal status: 04/27/24: ***  3.  Pt will improve A/ROM in right wrist flexion/extension from unsafe to test to at least 60 degrees each, to have functional motion for tasks like reach and grasp.  Goal status: 04/27/24: ***  4.  Pt will improve strength in right wrist flexion/extension from unsafe to test to at least 4+/5 MMT to have increased functional ability to carry out selfcare and higher-level homecare tasks with less difficulty. Goal status: 04/27/24: ***  5.  Pt will improve coordination skills in right hand and arm, as seen by within functional limit score on nine-hole peg testing to have increased functional ability to carry out fine motor tasks (fasteners, etc.) and more complex, coordinated IADLs (meal prep, sports, etc.).  Goal status: 04/27/24: ***  6.  Pt will decrease pain at worst from 7/10 to 3/10 or better to have better sleep and occupational participation in daily roles. Goal status: 04/27/24: ***   ASSESSMENT:  CLINICAL IMPRESSION: 04/27/24: ***   11/101/25: Now is only 8# off from Rt hand to Left hand, now improving FMS and pain, still need some strength and coord to build confidence     PLAN:  OT FREQUENCY: 1-2x/week  OT DURATION: 6 weeks through 05/01/24 and up to 10 total visits as needed   PLANNED INTERVENTIONS: 97535 self care/ADL training, 02889 therapeutic exercise, 97530 therapeutic activity, 97112 neuromuscular re-education, 97140 manual therapy, 97035 ultrasound, 97032 electrical stimulation (manual), 97760 Orthotic Initial, [id number] Orthotic/Prosthetic subsequent, compression bandaging, Dry needling, energy conservation, coping strategies training, and patient/family education  CONSULTED AND AGREED WITH PLAN OF CARE: Patient  PLAN  FOR NEXT SESSION:   ***   Faithann Natal  Vonzell Lindblad, OTR/L, CHT  04/23/2024, 8:57 AM

## 2024-04-24 ENCOUNTER — Telehealth: Payer: Self-pay | Admitting: Orthopedic Surgery

## 2024-04-24 NOTE — Telephone Encounter (Signed)
 Received call from patient stating he needs to send us  an ADA form as his employer cannot accommodate the restrictions. I gave patient our fax number and my email address to send the form to.

## 2024-04-27 ENCOUNTER — Encounter: Admitting: Rehabilitative and Restorative Service Providers"

## 2024-04-27 ENCOUNTER — Telehealth: Payer: Self-pay | Admitting: Rehabilitative and Restorative Service Providers"

## 2024-04-27 NOTE — Telephone Encounter (Signed)
 OT called patient to discuss missed (no show) appointment. OT spoke with pt about discharging therapy today. He states he would be comfortable with that, at this point.   He was recommended to continue HEP, desensitization, strengthening for a month, to anticipate full return to duty after his next doctor's appointment.  2 weeks ago he had over 100 pounds of grip strength, and this should just continue to improve.

## 2024-05-04 NOTE — Therapy (Signed)
 OUTPATIENT PHYSICAL THERAPY THORACOLUMBAR EVALUATION   Patient Name: Jose Ho MRN: [id number] DOB:[birth year], 23 y.o., male Today's Date: 05/04/2024  END OF SESSION:   Past Medical History:  Diagnosis Date   Anxiety    Past Surgical History:  Procedure Laterality Date   MOUTH SURGERY     NERVE REPAIR Right 03/03/2024   Procedure: DORSAL RADIAL SENSORY NERVE REPAIR;  Surgeon: Arlinda Buster, MD;  Location: [facility];  Service: Orthopedics;  Laterality: Right;   WOUND EXPLORATION Right 03/03/2024   Procedure: RIGHT WRIST WOUND EXPLORATION;  Surgeon: Arlinda Buster, MD;  Location: [facility];  Service: Orthopedics;  Laterality: Right;   Patient Active Problem List   Diagnosis Date Noted   Laceration of skin and cutaneous sensory nerve of right upper extremity 03/03/2024    PCP: ***  REFERRING PROVIDER: ***  REFERRING DIAG: ***  Rationale for Evaluation and Treatment: {HABREHAB:27488}  THERAPY DIAG:  No diagnosis found.  ONSET DATE: ***  SUBJECTIVE:                                                                                                                                                                                           SUBJECTIVE STATEMENT: ***  PERTINENT HISTORY:  ***  PAIN:  NPRS scale: ***/10 Pain location: *** Pain description: *** Aggravating factors: *** Relieving factors: ***  PRECAUTIONS: {Therapy precautions:24002}  WEIGHT BEARING RESTRICTIONS: {Yes ***/No:24003}  FALLS:  Has patient fallen in last 6 months? {fallsyesno:27318}  LIVING ENVIRONMENT: Lives with: {[facility] lives with:25569::lives with their family} Lives in: {Lives in:25570} Stairs: {opstairs:27293} Has following equipment at home: {Assistive devices:23999}  OCCUPATION: ***  PLOF: Independent  PATIENT GOALS: ***  Next MD Visit:    OBJECTIVE:   DIAGNOSTIC FINDINGS:  ***  PATIENT SURVEYS:  Patient-Specific Activity  Scoring Scheme  0 represents "unable to perform." 10 represents "able to perform at prior level. 0 1 2 3 4 5 6 7 8 9  10 (Date and Score)   Activity Eval     1. ***      2. ***      3. ***    4.    5.    Score ***    Total score = sum of the activity scores/number of activities Minimum detectable change (90%CI) for average score = 2 points Minimum detectable change (90%CI) for single activity score = 3 points  SCREENING FOR RED FLAGS: Bowel or bladder incontinence: {Yes/No:304960894} Cauda equina syndrome: {Yes/No:304960894}  COGNITION: Overall cognitive status: WFL normal      SENSATION: {sensation:27233}  MUSCLE LENGTH: Hamstrings: Right *** deg; Left *** deg Debby test:  Right *** deg; Left *** deg  POSTURE:  {posture:25561}  PALPATION: ***  LUMBAR ROM:   Directional Preference Assessment: Centralization: Peripheralization:   AROM eval  Flexion   Extension   Right lateral flexion   Left lateral flexion   Right rotation   Left rotation    (Blank rows = not tested)  LOWER EXTREMITY ROM:     {AROM/PROM:27142}  Right eval Left eval  Hip flexion    Hip extension    Hip abduction    Hip adduction    Hip internal rotation    Hip external rotation    Knee flexion    Knee extension    Ankle dorsiflexion    Ankle plantarflexion    Ankle inversion    Ankle eversion     (Blank rows = not tested)  LOWER EXTREMITY MMT:    MMT Right eval Left eval  Hip flexion    Hip extension    Hip abduction    Hip adduction    Hip internal rotation    Hip external rotation    Knee flexion    Knee extension    Ankle dorsiflexion    Ankle plantarflexion    Ankle inversion    Ankle eversion     (Blank rows = not tested)  LUMBAR SPECIAL TESTS:  {lumbar special test:25242}  FUNCTIONAL TESTS:  {Functional tests:24029}  GAIT:                                                                                                                                                                                                                    TODAY'S TREATMENT:                                                                                                         DATE: ***  Therex:    HEP instruction/performance c cues for techniques, handout provided.  Trial set performed of each for comprehension and symptom assessment.  See below for exercise list  PATIENT EDUCATION:  Education details: HEP, POC Person educated: Patient Education method:  Explanation, Demonstration, Verbal cues, and Handouts Education comprehension: verbalized understanding, returned demonstration, and verbal cues required  HOME EXERCISE PROGRAM: ***  ASSESSMENT:  CLINICAL IMPRESSION: Patient is a *** y.o. who comes to clinic with complaints of ***pain with mobility, strength and movement coordination deficits that impair their ability to perform usual daily and recreational functional activities without increase difficulty/symptoms at this time.  Patient to benefit from skilled PT services to address impairments and limitations to improve to previous level of function without restriction secondary to condition.   OBJECTIVE IMPAIRMENTS: {opptimpairments:25111}.   ACTIVITY LIMITATIONS: {activitylimitations:27494}  PARTICIPATION LIMITATIONS: {participationrestrictions:25113}  PERSONAL FACTORS: {Personal factors:25162} are also affecting patient's functional outcome.   REHAB POTENTIAL: {rehabpotential:25112}  CLINICAL DECISION MAKING: {clinical decision making:25114}  EVALUATION COMPLEXITY: {Evaluation complexity:25115}   GOALS: Goals reviewed with patient? Yes  SHORT TERM GOALS: (target date for Short term goals are 3 weeks ***)  1. Patient will demonstrate independent use of home exercise program to maintain progress from in clinic treatments.  Goal status: New  LONG TERM GOALS: (target dates for all long term goals are 10 weeks  *** )   1. Patient will  demonstrate/report pain at worst less than or equal to 2/10 to facilitate minimal limitation in daily activity secondary to pain symptoms.  Goal status: New   2. Patient will demonstrate independent use of home exercise program to facilitate ability to maintain/progress functional gains from skilled physical therapy services.  Goal status: New   3. Patient will demonstrate Patient specific functional scale avg > or = *** to indicate reduced disability due to condition.   Goal status: New   4. Patient will demonstrate lumbar extension 100 % WFL s symptoms to facilitate upright standing, walking posture at PLOF s limitation.  Goal status: New   5.  ***  Goal status: New   6.  *** Goal status: New   7.  *** Goal Status: New  PLAN:  PT FREQUENCY: 1-2x/week  PT DURATION: 10 weeks  PLANNED INTERVENTIONS: Can include 02853- PT Re-evaluation, 97110-Therapeutic exercises, 97530- Therapeutic activity, [id number]- Neuromuscular re-education, 97535- Self Care, 97140- Manual therapy, [phone]- Gait training, [phone]- Orthotic Fit/training, [phone]- Canalith repositioning, [id number]- Aquatic Therapy, [phone]- Electrical stimulation (unattended), [id number] Physical performance testing, 97016- Vasopneumatic device, [id number]- Ultrasound, [id number]- Traction (mechanical), [id number]- Ionotophoresis 4mg /ml Dexamethasone ,  79439 - Needle insertion w/o injection 1 or 2 muscles, 20561 - Needle insertion w/o injection 3 or more muscles.    Patient/Family education, Balance training, Stair training, Taping, Dry Needling, Joint mobilization, Joint manipulation, Spinal manipulation, Spinal mobilization, Scar mobilization, Vestibular training, Visual/preceptual remediation/compensation, DME instructions, Cryotherapy, and Moist heat.  All performed as medically necessary.  All included unless contraindicated  PLAN FOR NEXT SESSION: Review HEP knowledge/results.    Ozell Silvan, PT, DPT, OCS, ATC 05/04/24  1:11 PM

## 2024-05-05 ENCOUNTER — Ambulatory Visit: Admitting: Rehabilitative and Restorative Service Providers"

## 2024-05-05 ENCOUNTER — Encounter: Payer: Self-pay | Admitting: Physical Medicine and Rehabilitation

## 2024-05-05 ENCOUNTER — Encounter: Payer: Self-pay | Admitting: Rehabilitative and Restorative Service Providers"

## 2024-05-05 ENCOUNTER — Ambulatory Visit: Admitting: Physical Medicine and Rehabilitation

## 2024-05-05 DIAGNOSIS — M546 Pain in thoracic spine: Secondary | ICD-10-CM | POA: Diagnosis not present

## 2024-05-05 DIAGNOSIS — M25511 Pain in right shoulder: Secondary | ICD-10-CM | POA: Diagnosis not present

## 2024-05-05 DIAGNOSIS — R293 Abnormal posture: Secondary | ICD-10-CM

## 2024-05-05 DIAGNOSIS — M25512 Pain in left shoulder: Secondary | ICD-10-CM

## 2024-05-05 DIAGNOSIS — M7918 Myalgia, other site: Secondary | ICD-10-CM

## 2024-05-05 DIAGNOSIS — M79604 Pain in right leg: Secondary | ICD-10-CM

## 2024-05-05 DIAGNOSIS — M5459 Other low back pain: Secondary | ICD-10-CM | POA: Diagnosis not present

## 2024-05-05 DIAGNOSIS — M545 Low back pain, unspecified: Secondary | ICD-10-CM

## 2024-05-05 DIAGNOSIS — M542 Cervicalgia: Secondary | ICD-10-CM | POA: Diagnosis not present

## 2024-05-05 DIAGNOSIS — G8929 Other chronic pain: Secondary | ICD-10-CM

## 2024-05-05 DIAGNOSIS — M79605 Pain in left leg: Secondary | ICD-10-CM

## 2024-05-05 NOTE — Progress Notes (Signed)
 Pain Scale   Average Pain 8 Patient advising he has spine pain pain is constant, Patient is here for MRI Review.        +Driver, -BT, -Dye Allergies.

## 2024-05-05 NOTE — Progress Notes (Signed)
 Jose Ho - 23 y.o. male MRN [id number]  Date of birth: 02-23-2001  Office Visit Note: Visit Date: 05/05/2024 PCP: Marvine Rush, MD Referred by: Marvine Rush, MD  Subjective: Chief Complaint  Patient presents with   Spine - Pain   HPI: Jose Ho is a 23 y.o. male who comes in today for evaluation of chronic, worsening and severe bilateral lower back pain radiating up to middle back, shoulders and neck. 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 His pain worsens with prolonged standing and sleeping on back. He describes pain as sore, aching and shooting sensation, currently rates as 7 out of 10. Recent lumbar MRI imaging shows disc desiccation and mild-to-moderate facet arthrosis at L4-L5. There is no protrusion, significant foraminal or spinal stenosis. Some relief of pain with home exercise regimen, rest and use of medications. He recently started formal physical therapy and was told his back is stiff as a board. His pain radiates to multiple regions including thoracic spine, shoulders and neck. This pain is chronic and ongoing for several years, states he has become used too the pain over time. Patient denies focal weakness, numbness and tingling. No recent trauma or falls.       Review of Systems  Musculoskeletal:  Positive for back pain, myalgias and neck pain.  Neurological:  Negative for tingling, sensory change, focal weakness and weakness.  All other systems reviewed and are negative.  Otherwise per HPI.  Assessment & Plan: Visit Diagnoses:    ICD-10-CM   1. Chronic bilateral low back pain without sciatica  M54.50    G89.29     2. Chronic bilateral thoracic back pain  M54.6    G89.29     3. Chronic pain of both shoulders  M25.511    G89.29    M25.512     4. Neck pain  M54.2     5. Myofascial pain syndrome  M79.18        Plan: Findings:  Chronic, worsening and severe bilateral lower back pain radiating up his back to  middle back, shoulders and neck. Patient continues to have pain despite good conservative therapies such as formal physical therapy, home exercise regimen, rest and use of medications. I discussed recent lumbar MRI with him today using imaging and spine model. There is mild to moderate facet arthropathy, likely advanced for his age at the level of L4-L5. I do not think this finding explains his radiating back to entire back, shoulders and neck. At this point, would not recommend performing interventional spine procedures, his best option would be to continue with formal physical therapy with a focus on strengthening and range of motion. I encouraged him to develop a strong home exercise regimen as well. We discussed medication management, could look at Cymbalta, however he recently started Lexapro. 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 We are happy to see him back as needed. He can also follow up with Dr. Kit at [facility] for left leg/foot issues. No red flag symptoms noted upon exam today.     Meds & Orders: No orders of the defined types were placed in this encounter.  No orders of the defined types were placed in this encounter.   Follow-up: Return if symptoms worsen or fail to improve.   Procedures: No procedures performed      Clinical History: MR LUMBAR SPINE WITHOUT IV  CONTRAST   COMPARISON: None available   CLINICAL HISTORY: Low back pain.   TECHNIQUE: SAG T2, SAG T1, SAG STIR, AX T2, AX T1 without IV contrast.   FINDINGS: There is normal alignment of the lumbar spine. There is mild disc desiccation and mild facet arthrosis. No reactive edema is identified. No significant Modic changes are present. No malalignment is present. There is no vertebral body height loss, subluxation or marrow replacing process. The sacrum and SI joints are unremarkable so far as visualized. Conus and cauda equina are  unremarkable.   T12-L1: There is no focal disc protrusion, foraminal or spinal stenosis.   L1-2: There is no focal disc protrusion, foraminal or spinal stenosis.   L2-3: There is no focal disc protrusion, foraminal or spinal stenosis.   L3-4: There is no focal disc protrusion, foraminal or spinal stenosis. Mild facet arthrosis.   L4-5: Disc desiccation and moderate facet arthrosis. No significant foraminal or spinal stenosis.   L5-S1: Disc desiccation mild facet arthrosis. No significant foraminal or spinal stenosis.   The retroperitoneal structures demonstrate no significant abnormality.   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 There is no protrusion, significant foraminal or spinal stenosis. No acute abnormality.   Electronically signed by: Norleen Satchel MD 04/20/2024 12:12 PM EST RP Workstation: [id number]   He reports that he has quit smoking. His smoking use included e-cigarettes and cigarettes. He uses smokeless tobacco. No results for input(s): HGBA1C, LABURIC in the last 8760 hours.  Objective:  VS:  HT:    WT:   BMI:     BP:   HR: bpm  TEMP: ( )  RESP:  Physical Exam Vitals and nursing note reviewed.  HENT:     Head: Normocephalic and atraumatic.     Right Ear: External ear normal.     Left Ear: External ear normal.     Mouth/Throat:     Mouth: Mucous membranes are moist.  Eyes:     Extraocular Movements: Extraocular movements intact.  Cardiovascular:     Rate and Rhythm: Normal rate.     Pulses: Normal pulses.  Pulmonary:     Effort: Pulmonary effort is normal.  Abdominal:     General: Abdomen is flat. There is no distension.  Musculoskeletal:        General: Tenderness present.     Cervical back: Tenderness present.     Comments: Patient rises from seated position to standing without difficulty. Good lumbar range of motion. No pain noted with facet loading. 5/5 strength noted with bilateral hip flexion, knee  flexion/extension, ankle dorsiflexion/plantarflexion and EHL. No clonus noted bilaterally. No pain upon palpation of greater trochanters. No pain with internal/external rotation of bilateral hips. Sensation intact bilaterally. Myofascial tenderness noted to bilateral lumbar, thoracic and cervical paraspinal regions upon palpation. Negative slump test bilaterally. Ambulates without aid, gait steady.     Skin:    General: Skin is warm and dry.     Capillary Refill: Capillary refill takes less than 2 seconds.  Neurological:     General: No focal deficit present.     Mental Status: He is alert and oriented to person, place, and time.  Psychiatric:        Mood and Affect: Mood normal.        Behavior: Behavior normal.     Ortho Exam  Imaging: No results found.  Past Medical/Family/Surgical/Social History: Medications & Allergies reviewed per EMR, new medications updated. Patient Active Problem List   Diagnosis Date Noted  Laceration of skin and cutaneous sensory nerve of right upper extremity 03/03/2024   Past Medical History:  Diagnosis Date   Anxiety    No family history on file. Past Surgical History:  Procedure Laterality Date   MOUTH SURGERY     NERVE REPAIR Right 03/03/2024   Procedure: DORSAL RADIAL SENSORY NERVE REPAIR;  Surgeon: Arlinda Buster, MD;  Location: [facility];  Service: Orthopedics;  Laterality: Right;   WOUND EXPLORATION Right 03/03/2024   Procedure: RIGHT WRIST WOUND EXPLORATION;  Surgeon: Arlinda Buster, MD;  Location: [facility];  Service: Orthopedics;  Laterality: Right;   Social History   Occupational History   Not on file  Tobacco Use   Smoking status: Former    Current packs/day: 0.50    Types: E-cigarettes, Cigarettes   Smokeless tobacco: Current  Vaping Use   Vaping status: Never Used  Substance and Sexual Activity   Alcohol use: No   Drug use: No   Sexual activity: Not on file

## 2024-05-08 ENCOUNTER — Ambulatory Visit: Admitting: Physical Medicine and Rehabilitation

## 2024-05-15 ENCOUNTER — Encounter: Admitting: Rehabilitative and Restorative Service Providers"

## 2024-05-20 ENCOUNTER — Encounter: Admitting: Rehabilitative and Restorative Service Providers"

## 2024-05-21 NOTE — Progress Notes (Unsigned)
° °  Jose Ho - 23 y.o. male MRN [id number]  Date of birth: 06-23-2000  Office Visit Note: Visit Date: 05/25/2024 PCP: Marvine Rush, MD Referred by: Marvine Rush, MD  Subjective:  HPI: Jose Ho is a 23 y.o. male who presents today for follow up 11 weeks status post right wrist penetrating wound exploration. Right wrist dorsal radial sensory nerve repair.  Pertinent ROS were reviewed with the patient and found to be negative unless otherwise specified above in HPI.   Assessment & Plan: Visit Diagnoses: No diagnosis found.  Plan: ***  Follow-up: No follow-ups on file.   Meds & Orders: No orders of the defined types were placed in this encounter.  No orders of the defined types were placed in this encounter.    Procedures: No procedures performed       Objective:   Vital Signs: There were no vitals taken for this visit.  Ortho Exam ***  Imaging: No results found.   Anshul Afton Alderton, M.D. [facility], Hand Surgery

## 2024-05-22 ENCOUNTER — Telehealth: Payer: Self-pay | Admitting: Rehabilitative and Restorative Service Providers"

## 2024-05-22 ENCOUNTER — Encounter: Admitting: Rehabilitative and Restorative Service Providers"

## 2024-05-22 NOTE — Telephone Encounter (Signed)
 Pt has cancelled several visits since eval.  Called to confirm desire to attend any future visits.  Left message to return call about plans.   Ozell Silvan, PT, DPT, OCS, ATC 05/22/2024  8:21 AM

## 2024-05-25 ENCOUNTER — Encounter: Admitting: Physical Therapy

## 2024-05-25 ENCOUNTER — Ambulatory Visit (INDEPENDENT_AMBULATORY_CARE_PROVIDER_SITE_OTHER): Admitting: Orthopedic Surgery

## 2024-05-25 DIAGNOSIS — S648X1D Injury of other nerves at wrist and hand level of right arm, subsequent encounter: Secondary | ICD-10-CM

## 2024-05-27 ENCOUNTER — Encounter: Admitting: Rehabilitative and Restorative Service Providers"

## 2024-06-05 ENCOUNTER — Encounter: Admitting: Rehabilitative and Restorative Service Providers"

## 2024-06-22 ENCOUNTER — Ambulatory Visit: Admitting: Orthopedic Surgery

## 2024-06-22 ENCOUNTER — Telehealth: Payer: Self-pay | Admitting: Orthopedic Surgery

## 2024-06-22 DIAGNOSIS — S648X1D Injury of other nerves at wrist and hand level of right arm, subsequent encounter: Secondary | ICD-10-CM

## 2024-06-22 NOTE — Progress Notes (Signed)
 "  Jose Ho - 24 y.o. male MRN [id number]  Date of birth: [birth year]  Office Visit Note: Visit Date: 06/22/2024 PCP: Marvine Rush, MD Referred by: Marvine Rush, MD  Subjective:  HPI: Jose Ho is a pleasant 24 y.o. male who presents today for follow up 3 months status post right wrist penetrating wound exploration. Right wrist dorsal radial sensory nerve repair. He is doing well today. Has continued home exercises in order to return full duty to work.  Pertinent ROS were reviewed with the patient and found to be negative unless otherwise specified above in HPI.    Assessment & Plan: Visit Diagnoses: No diagnosis found.  Plan: He has done well postoperatively and is eager to return to work duties in full.  He demonstrates appropriate range of motion and strength today on his examination to allow for return without restriction.  Work note was provided today.  He is welcome to return to me as needed moving forward.  Follow-up: No follow-ups on file.   Meds & Orders: No orders of the defined types were placed in this encounter.  No orders of the defined types were placed in this encounter.    Procedures: No procedures performed      Clinical History: MR LUMBAR SPINE WITHOUT IV CONTRAST   COMPARISON: None available   CLINICAL HISTORY: Low back pain.   TECHNIQUE: SAG T2, SAG T1, SAG STIR, AX T2, AX T1 without IV contrast.   FINDINGS: There is normal alignment of the lumbar spine. There is mild disc desiccation and mild facet arthrosis. No reactive edema is identified. No significant Modic changes are present. No malalignment is present. There is no vertebral body height loss, subluxation or marrow replacing process. The sacrum and SI joints are unremarkable so far as visualized. Conus and cauda equina are unremarkable.   T12-L1: There is no focal disc protrusion, foraminal or spinal stenosis.   L1-2: There is no focal disc protrusion, foraminal or spinal  stenosis.   L2-3: There is no focal disc protrusion, foraminal or spinal stenosis.   L3-4: There is no focal disc protrusion, foraminal or spinal stenosis. Mild facet arthrosis.   L4-5: Disc desiccation and moderate facet arthrosis. No significant foraminal or spinal stenosis.   L5-S1: Disc desiccation mild facet arthrosis. No significant foraminal or spinal stenosis.   The retroperitoneal structures demonstrate no significant abnormality.   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 There is no protrusion, significant foraminal or spinal stenosis. No acute abnormality.   Electronically signed by: Rush Satchel MD 04/20/2024 12:12 PM EST RP Workstation: [id number]  He reports that he has quit smoking. His smoking use included e-cigarettes and cigarettes. He uses smokeless tobacco. No results for input(s): HGBA1C, LABURIC in the last 8760 hours.  Objective:   Vital Signs: There were no vitals taken for this visit.  Physical Exam  Gen: Well-appearing, in no acute distress; non-toxic CV: Regular Rate. Well-perfused. Warm.  Resp: Breathing unlabored on room air; no wheezing. Psych: Fluid speech in conversation; appropriate affect; normal thought process  Ortho Exam - Right wrist with well healed incisional site, sensory limited to light touch DRSN, positive advancing tinels, thumb opposition to small finger DPC, thumb pinch strength right 20, left 20; grip strength Jamar 2 right 115, left 125   Imaging: No results found.  Past Medical/Family/Surgical/Social History: Medications & Allergies reviewed per EMR, new medications updated. Patient Active Problem List   Diagnosis Date Noted   Laceration of skin and cutaneous sensory  nerve of right upper extremity 03/03/2024   Past Medical History:  Diagnosis Date   Anxiety    No family history on file. Past Surgical History:  Procedure Laterality Date   MOUTH SURGERY     NERVE REPAIR Right 03/03/2024    Procedure: DORSAL RADIAL SENSORY NERVE REPAIR;  Surgeon: Arlinda Buster, MD;  Location: [facility];  Service: Orthopedics;  Laterality: Right;   WOUND EXPLORATION Right 03/03/2024   Procedure: RIGHT WRIST WOUND EXPLORATION;  Surgeon: Arlinda Buster, MD;  Location: [facility];  Service: Orthopedics;  Laterality: Right;   Social History   Occupational History   Not on file  Tobacco Use   Smoking status: Former    Current packs/day: 0.50    Types: E-cigarettes, Cigarettes   Smokeless tobacco: Current  Vaping Use   Vaping status: Never Used  Substance and Sexual Activity   Alcohol use: No   Drug use: No   Sexual activity: Not on file    Krystie Leiter Estela) Arlinda, M.D. [facility], Hand Surgery  "

## 2024-06-22 NOTE — Telephone Encounter (Signed)
 Faxed 06/22/24 RTW note to [employer] and FMLA per pts request
